# Patient Record
Sex: Male | Born: 1950 | Race: White | Hispanic: No | Marital: Married | Smoking: Current every day smoker
Health system: Southern US, Community
[De-identification: ages and names within clinical notes are randomized; demographics above are authoritative.]

## PROBLEM LIST (undated history)

## (undated) DIAGNOSIS — E785 Hyperlipidemia, unspecified: Secondary | ICD-10-CM

## (undated) DIAGNOSIS — I10 Essential (primary) hypertension: Secondary | ICD-10-CM

## (undated) DIAGNOSIS — I2510 Atherosclerotic heart disease of native coronary artery without angina pectoris: Secondary | ICD-10-CM

## (undated) DIAGNOSIS — I739 Peripheral vascular disease, unspecified: Secondary | ICD-10-CM

## (undated) DIAGNOSIS — J42 Unspecified chronic bronchitis: Secondary | ICD-10-CM

## (undated) HISTORY — DX: Essential (primary) hypertension: I10

## (undated) HISTORY — DX: Unspecified chronic bronchitis: J42

## (undated) HISTORY — DX: Hyperlipidemia, unspecified: E78.5

## (undated) HISTORY — PX: OTHER SURGICAL HISTORY: SHX169

## (undated) HISTORY — DX: Peripheral vascular disease, unspecified: I73.9

## (undated) HISTORY — DX: Atherosclerotic heart disease of native coronary artery without angina pectoris: I25.10

---

## 1995-09-16 HISTORY — PX: CORONARY ARTERY BYPASS GRAFT: SHX141

## 2004-03-31 ENCOUNTER — Ambulatory Visit: Payer: Self-pay | Admitting: Internal Medicine

## 2004-04-27 ENCOUNTER — Ambulatory Visit: Payer: Self-pay

## 2004-05-27 ENCOUNTER — Ambulatory Visit: Payer: Self-pay | Admitting: Cardiology

## 2004-06-22 ENCOUNTER — Ambulatory Visit: Payer: Self-pay | Admitting: Cardiology

## 2004-12-09 ENCOUNTER — Ambulatory Visit: Payer: Self-pay

## 2005-02-22 ENCOUNTER — Inpatient Hospital Stay (HOSPITAL_COMMUNITY): Admission: EM | Admit: 2005-02-22 | Discharge: 2005-02-25 | Payer: Self-pay | Admitting: Internal Medicine

## 2005-02-23 ENCOUNTER — Ambulatory Visit: Payer: Self-pay | Admitting: Cardiology

## 2005-02-23 HISTORY — PX: CARDIAC CATHETERIZATION: SHX172

## 2005-03-13 ENCOUNTER — Ambulatory Visit: Payer: Self-pay | Admitting: Cardiology

## 2005-04-26 ENCOUNTER — Ambulatory Visit: Payer: Self-pay | Admitting: Cardiology

## 2005-09-07 ENCOUNTER — Ambulatory Visit: Payer: Self-pay | Admitting: Cardiology

## 2006-05-17 ENCOUNTER — Ambulatory Visit: Payer: Self-pay | Admitting: Cardiology

## 2006-05-17 LAB — CONVERTED CEMR LAB
ALT: 18 units/L (ref 0–40)
AST: 24 units/L (ref 0–37)
Albumin: 4.1 g/dL (ref 3.5–5.2)
Alkaline Phosphatase: 53 units/L (ref 39–117)
Bilirubin, Direct: 0.1 mg/dL (ref 0.0–0.3)
Cholesterol: 140 mg/dL (ref 0–200)
Direct LDL: 77 mg/dL
HDL: 30.6 mg/dL — ABNORMAL LOW (ref >39.0)
LDL Cholesterol: 57 mg/dL (ref 0–99)
Total Bilirubin: 0.5 mg/dL (ref 0.3–1.2)
Total CHOL/HDL Ratio: 4.6
Total Protein: 7.4 g/dL (ref 6.0–8.3)
Triglycerides: 261 mg/dL (ref 0–149)
VLDL: 52 mg/dL — ABNORMAL HIGH (ref 0–40)

## 2006-11-02 ENCOUNTER — Ambulatory Visit: Payer: Self-pay | Admitting: Internal Medicine

## 2006-11-02 LAB — CONVERTED CEMR LAB
ALT: 16 units/L (ref 0–53)
AST: 20 units/L (ref 0–37)
Albumin: 3.7 g/dL (ref 3.5–5.2)
Alkaline Phosphatase: 47 units/L (ref 39–117)
BUN: 10 mg/dL (ref 6–23)
Basophils Absolute: 0.1 10*3/uL (ref 0.0–0.1)
Basophils Relative: 1 % (ref 0.0–1.0)
Bilirubin Urine: NEGATIVE
Bilirubin, Direct: 0.1 mg/dL (ref 0.0–0.3)
CO2: 32 meq/L (ref 19–32)
Calcium: 9.6 mg/dL (ref 8.4–10.5)
Chloride: 106 meq/L (ref 96–112)
Cholesterol: 169 mg/dL (ref 0–200)
Creatinine, Ser: 1 mg/dL (ref 0.4–1.5)
Eosinophils Absolute: 0.2 10*3/uL (ref 0.0–0.6)
Eosinophils Relative: 2.6 % (ref 0.0–5.0)
GFR calc Af Amer: 99 mL/min
GFR calc non Af Amer: 82 mL/min
Glucose, Bld: 98 mg/dL (ref 70–99)
HCT: 47.9 % (ref 39.0–52.0)
HDL: 28.1 mg/dL — ABNORMAL LOW (ref >39.0)
Hemoglobin, Urine: NEGATIVE
Hemoglobin: 16.4 g/dL (ref 13.0–17.0)
Ketones, ur: NEGATIVE mg/dL
LDL Cholesterol: 106 mg/dL — ABNORMAL HIGH (ref 0–99)
Leukocytes, UA: NEGATIVE
Lymphocytes Relative: 24.8 % (ref 12.0–46.0)
MCHC: 34.3 g/dL (ref 30.0–36.0)
MCV: 94.6 fL (ref 78.0–100.0)
Monocytes Absolute: 0.6 10*3/uL (ref 0.2–0.7)
Monocytes Relative: 9.3 % (ref 3.0–11.0)
Neutro Abs: 3.8 10*3/uL (ref 1.4–7.7)
Neutrophils Relative %: 62.3 % (ref 43.0–77.0)
Nitrite: NEGATIVE
Platelets: 248 10*3/uL (ref 150–400)
Potassium: 4.7 meq/L (ref 3.5–5.1)
RBC: 5.06 M/uL (ref 4.22–5.81)
RDW: 12.3 % (ref 11.5–14.6)
Sodium: 143 meq/L (ref 135–145)
Specific Gravity, Urine: 1.02 (ref 1.000–1.03)
TSH: 1.55 microintl units/mL (ref 0.35–5.50)
Total Bilirubin: 0.9 mg/dL (ref 0.3–1.2)
Total CHOL/HDL Ratio: 6
Total Protein, Urine: NEGATIVE mg/dL
Total Protein: 6.9 g/dL (ref 6.0–8.3)
Triglycerides: 174 mg/dL — ABNORMAL HIGH (ref 0–149)
Urine Glucose: NEGATIVE mg/dL
Urobilinogen, UA: 0.2 (ref 0.0–1.0)
VLDL: 35 mg/dL (ref 0–40)
WBC: 6.2 10*3/uL (ref 4.5–10.5)
pH: 6 (ref 5.0–8.0)

## 2007-02-27 ENCOUNTER — Ambulatory Visit: Payer: Self-pay | Admitting: Internal Medicine

## 2007-03-07 ENCOUNTER — Encounter: Payer: Self-pay | Admitting: Internal Medicine

## 2007-03-07 DIAGNOSIS — J209 Acute bronchitis, unspecified: Secondary | ICD-10-CM

## 2007-03-07 DIAGNOSIS — I2510 Atherosclerotic heart disease of native coronary artery without angina pectoris: Secondary | ICD-10-CM | POA: Insufficient documentation

## 2007-03-07 DIAGNOSIS — I259 Chronic ischemic heart disease, unspecified: Secondary | ICD-10-CM | POA: Insufficient documentation

## 2007-03-07 DIAGNOSIS — E785 Hyperlipidemia, unspecified: Secondary | ICD-10-CM | POA: Insufficient documentation

## 2007-03-07 DIAGNOSIS — I739 Peripheral vascular disease, unspecified: Secondary | ICD-10-CM

## 2007-03-07 DIAGNOSIS — J42 Unspecified chronic bronchitis: Secondary | ICD-10-CM

## 2008-06-16 ENCOUNTER — Telehealth (INDEPENDENT_AMBULATORY_CARE_PROVIDER_SITE_OTHER): Payer: Self-pay | Admitting: *Deleted

## 2008-07-23 ENCOUNTER — Telehealth: Payer: Self-pay | Admitting: Internal Medicine

## 2008-07-24 ENCOUNTER — Ambulatory Visit: Payer: Self-pay | Admitting: Internal Medicine

## 2008-07-24 DIAGNOSIS — I10 Essential (primary) hypertension: Secondary | ICD-10-CM

## 2008-07-24 DIAGNOSIS — F528 Other sexual dysfunction not due to a substance or known physiological condition: Secondary | ICD-10-CM

## 2008-07-24 LAB — CONVERTED CEMR LAB
ALT: 14 units/L (ref 0–53)
AST: 19 units/L (ref 0–37)
Albumin: 3.9 g/dL (ref 3.5–5.2)
Alkaline Phosphatase: 51 units/L (ref 39–117)
BUN: 13 mg/dL (ref 6–23)
Basophils Absolute: 0 10*3/uL (ref 0.0–0.1)
Basophils Relative: 0.2 % (ref 0.0–3.0)
Bilirubin, Direct: 0.2 mg/dL (ref 0.0–0.3)
CO2: 31 meq/L (ref 19–32)
CRP, High Sensitivity: 6 — ABNORMAL HIGH (ref 0.00–5.00)
Calcium: 9.6 mg/dL (ref 8.4–10.5)
Chloride: 106 meq/L (ref 96–112)
Cholesterol: 172 mg/dL (ref 0–200)
Creatinine, Ser: 1.1 mg/dL (ref 0.4–1.5)
Eosinophils Absolute: 0.1 10*3/uL (ref 0.0–0.7)
Eosinophils Relative: 2 % (ref 0.0–5.0)
GFR calc non Af Amer: 72.82 mL/min (ref >60)
Glucose, Bld: 99 mg/dL (ref 70–99)
HCT: 46.2 % (ref 39.0–52.0)
HDL: 32 mg/dL — ABNORMAL LOW (ref >39.00)
Hemoglobin: 16.4 g/dL (ref 13.0–17.0)
LDL Cholesterol: 103 mg/dL — ABNORMAL HIGH (ref 0–99)
Lymphocytes Relative: 25.3 % (ref 12.0–46.0)
Lymphs Abs: 1.6 10*3/uL (ref 0.7–4.0)
MCHC: 35.4 g/dL (ref 30.0–36.0)
MCV: 94.9 fL (ref 78.0–100.0)
Monocytes Absolute: 0.5 10*3/uL (ref 0.1–1.0)
Monocytes Relative: 7.4 % (ref 3.0–12.0)
Neutro Abs: 4 10*3/uL (ref 1.4–7.7)
Neutrophils Relative %: 65.1 % (ref 43.0–77.0)
Platelets: 222 10*3/uL (ref 150.0–400.0)
Potassium: 4.9 meq/L (ref 3.5–5.1)
RBC: 4.88 M/uL (ref 4.22–5.81)
RDW: 12.7 % (ref 11.5–14.6)
Sodium: 142 meq/L (ref 135–145)
TSH: 2.2 microintl units/mL (ref 0.35–5.50)
Total Bilirubin: 1 mg/dL (ref 0.3–1.2)
Total CHOL/HDL Ratio: 5
Total Protein: 7.2 g/dL (ref 6.0–8.3)
Triglycerides: 184 mg/dL — ABNORMAL HIGH (ref 0.0–149.0)
VLDL: 36.8 mg/dL (ref 0.0–40.0)
WBC: 6.2 10*3/uL (ref 4.5–10.5)

## 2009-05-25 ENCOUNTER — Telehealth (INDEPENDENT_AMBULATORY_CARE_PROVIDER_SITE_OTHER): Payer: Self-pay | Admitting: *Deleted

## 2009-05-27 ENCOUNTER — Telehealth (INDEPENDENT_AMBULATORY_CARE_PROVIDER_SITE_OTHER): Payer: Self-pay | Admitting: *Deleted

## 2009-06-23 ENCOUNTER — Ambulatory Visit: Payer: Self-pay | Admitting: Internal Medicine

## 2009-06-23 LAB — CONVERTED CEMR LAB
ALT: 20 units/L (ref 0–53)
AST: 21 units/L (ref 0–37)
Albumin: 3.9 g/dL (ref 3.5–5.2)
Alkaline Phosphatase: 57 units/L (ref 39–117)
BUN: 12 mg/dL (ref 6–23)
Basophils Absolute: 0.1 10*3/uL (ref 0.0–0.1)
Basophils Relative: 0.8 % (ref 0.0–3.0)
Bilirubin, Direct: 0 mg/dL (ref 0.0–0.3)
CO2: 31 meq/L (ref 19–32)
CRP, High Sensitivity: 7.1 — ABNORMAL HIGH (ref 0.00–5.00)
Calcium: 9.8 mg/dL (ref 8.4–10.5)
Chloride: 108 meq/L (ref 96–112)
Cholesterol: 177 mg/dL (ref 0–200)
Creatinine, Ser: 1.1 mg/dL (ref 0.4–1.5)
Direct LDL: 99.2 mg/dL
Eosinophils Absolute: 0.2 10*3/uL (ref 0.0–0.7)
Eosinophils Relative: 2.5 % (ref 0.0–5.0)
GFR calc non Af Amer: 72.59 mL/min (ref >60)
Glucose, Bld: 98 mg/dL (ref 70–99)
HCT: 49.3 % (ref 39.0–52.0)
HDL: 39.6 mg/dL (ref >39.00)
Hemoglobin: 16.5 g/dL (ref 13.0–17.0)
LDH: 137 units/L (ref 94–250)
Lymphocytes Relative: 21.7 % (ref 12.0–46.0)
Lymphs Abs: 1.5 10*3/uL (ref 0.7–4.0)
MCHC: 33.4 g/dL (ref 30.0–36.0)
MCV: 98.2 fL (ref 78.0–100.0)
Monocytes Absolute: 0.6 10*3/uL (ref 0.1–1.0)
Monocytes Relative: 9 % (ref 3.0–12.0)
Neutro Abs: 4.3 10*3/uL (ref 1.4–7.7)
Neutrophils Relative %: 66 % (ref 43.0–77.0)
Platelets: 233 10*3/uL (ref 150.0–400.0)
Potassium: 4.4 meq/L (ref 3.5–5.1)
RBC: 5.01 M/uL (ref 4.22–5.81)
RDW: 11.9 % (ref 11.5–14.6)
Sodium: 143 meq/L (ref 135–145)
Total Bilirubin: 0.5 mg/dL (ref 0.3–1.2)
Total CHOL/HDL Ratio: 4
Total Protein: 7.3 g/dL (ref 6.0–8.3)
Triglycerides: 273 mg/dL — ABNORMAL HIGH (ref 0.0–149.0)
VLDL: 54.6 mg/dL — ABNORMAL HIGH (ref 0.0–40.0)
WBC: 6.7 10*3/uL (ref 4.5–10.5)

## 2009-06-25 ENCOUNTER — Ambulatory Visit: Payer: Self-pay | Admitting: Internal Medicine

## 2009-06-25 DIAGNOSIS — F17210 Nicotine dependence, cigarettes, uncomplicated: Secondary | ICD-10-CM

## 2009-07-19 ENCOUNTER — Telehealth: Payer: Self-pay | Admitting: Internal Medicine

## 2009-08-20 ENCOUNTER — Ambulatory Visit: Payer: Self-pay | Admitting: Internal Medicine

## 2010-05-19 NOTE — Assessment & Plan Note (Signed)
Summary: Primary svc/ f/u hbp, try back on ACE   Primary Provider/Referring Provider:  Sherene Sires  CC:  6 wk followup off ACE.  Pt states that his cough has improved since last seen.  He states that he has flet flushed since started on diovan and that he feels like BP has been increased.  He also has noticed blurry vision and muscle pains.  .  History of Present Illness: 5 yowm smoker with IHD/PVD no longer followed in Cardiology since Dr Samule Ohm left but still on plavix and asa  s/p CABG '97 and stent in 206  July 24, 2008 ov new co = ED.  no increase tia or claudication symptoms. Pt denies any significant sore throat, nasal congestion or excess secretions, fever, chills, sweats, unintended wt loss, pleuritic or exertional cp, orthopnea pnd or leg swelling.  rec viagra, responded.  June 25, 2009 cpx cc cough on and off  since before xmas , sometimes with green mucus, esp in am, esp for last month.  no sob. stop lisinopril and replace it with diovan 160 mg one daily use mucindex dm as needed for cough committ to quit smoking Please schedule a follow-up appointment in 6 weeks, sooner if needed  DISCUSS COLON SCREENING ON RETURN   Aug 20, 2009 6 wk followup off ACE.  Pt states that his cough has improved since last seen.  He states that he has flelt flushed since started on diovan and that he feels like BP has been increased.  He also has noticed blurry vision and muscle pains.  Pt denies any significant sore throat, dysphagia, itching, sneezing,  nasal congestion or excess secretions,  fever, chills, sweats, unintended wt loss, pleuritic or exertional cp, hempoptysis, change in activity tolerance  orthopnea pnd or leg swelling.   Current Medications (verified): 1)  Plavix 75 Mg  Tabs (Clopidogrel Bisulfate) .... Once Daily 2)  Adult Aspirin Low Strength 81 Mg  Tbdp (Aspirin) .... Once Daily 3)  Multi Vitamins .... Once Daily 4)  Simcor 500-20 Mg  Tb24 (Niacin-Simvastatin) .... Once Daily 5)   Nitro 0.4mg  .... As Needed 6)  Fish Oil 1200 Mg Caps (Omega-3 Fatty Acids) .Marland Kitchen.. 1 Once Daily 7)  Viagra 50 Mg Tabs (Sildenafil Citrate) .... One Tablet One Hour Before Activity 8)  Diovan 160 Mg  Tabs (Valsartan) .... One By Mouth Daily  Allergies (verified): No Known Drug Allergies  Past History:  Past Medical History: HYPERLIPIDEMIA (ICD-272.4) CAD (ICD-414.00)     - s/p CABG 1997     - LHC  02/23/2005 > stent of graft to cx, ef 55% PERIPHERAL VASCULAR DISEASE (ICD-443.9)     - 12/09/04 carotid dopplers  < 80% Right,  < 60 Left BRONCHITIS, CHRONIC (ICD-491.9) HYPERTENSION     - ACE d/c June 25, 2009 (chronic cough) >  resolved HYPERLIPIDEMIA (ICD-272.4)     - Target LDL < 70 due to ASCVCD HEALTH MAINTENANCE..............................................................Marland KitchenWert     -DT  July 24, 2008      -Pneumovax (second shot) July 24, 2008      -CPX June 25, 2009      -Colon screening rec 04/07/2004 > declined > rediscussed Aug 20, 2009 >> declined again  Family History: MI younger brother (x2 y younger)dsf MI Father @ 73 No cancer  Vital Signs:  Patient profile:   60 year old male Weight:      178 pounds O2 Sat:      100 % on Room  air Temp:     97.2 degrees F oral Pulse rate:   77 / minute BP sitting:   120 / 80  (left arm)  Vitals Entered By: Vernie Murders (Aug 20, 2009 9:10 AM)  O2 Flow:  Room air  Physical Exam  Additional Exam:  wt  181 July 24, 2008 > 178 June 25, 2009 > 178 Aug 20, 2009  HEENT: upper dentures, nl  turbinates, and orophanx. Nl external ear canals without cough reflex NECK :  without JVD/Nodes/TM/ nl carotid upstrokes bilaterally LUNGS: no acc muscle use, clear to A and P bilaterally without cough on insp or exp maneuvers CV:  RRR  no s3 or murmur or increase in P2, no edema   ABD:  soft and nontender with nl excursion in the supine position. No bruits or organomegaly, bowel sounds nl MS:  warm without deformities, calf tenderness,  cyanosis or clubbing SKIN: warm and dry without lesions      Impression & Recommendations:  Problem # 1:  HYPERTENSION, BENIGN (ICD-401.1)  The following medications were removed from the medication list:    Diovan 160 Mg Tabs (Valsartan) ..... One by mouth daily His updated medication list for this problem includes:    Lisinopril 10 Mg Tabs (Lisinopril) ..... One daily  Cough resolved.  Pt not convinced it was the ace and felt better overall on lisnopril and ok with me to rechallenge. See instructions for specific recommendations   Discussed the relative indication of ace here but emphasized he should be the same benefit from ARB and need to switch back to arb if cough recurs for any reason  Orders: Est. Patient Level III ([address])  Problem # 2:  SMOKER (ICD-305.1)  Discussed but not ready to committ to quit at this point - emphasized risks involved in continuing smoking and that patient should consider these in the context of the cost of smoking relative to the benefit obtained.   Also decline colon screening.  Orders: Est. Patient Level III ([address])  Medications Added to Medication List This Visit: 1)  Lisinopril 10 Mg Tabs (Lisinopril) .... One daily  Patient Instructions: 1)  ok to restart lisinopril but if chronic cough comes back you'll need to stop it until next visit 2)  GERD (REFLUX)  is a common cause of respiratory symptoms. It commonly presents without heartburn and can be treated with medication, but also with lifestyle changes including avoidance of late meals, excessive alcohol, smoking cessation, and avoid fatty foods, chocolate, peppermint, colas, red wine, and acidic juices such as orange juice. NO MINT OR MENTHOL PRODUCTS SO NO COUGH DROPS  3)  USE SUGARLESS CANDY INSTEAD (jolley ranchers)  4)  NO OIL BASED VITAMINS (Fish oil should be stopped if the cough comes back) 5)  Return to office in 3 months, sooner if needed

## 2010-05-19 NOTE — Progress Notes (Signed)
Summary: rx  Phone Note Call from Patient Call back at [phone]   Caller: Spouse-Eva Call For: Teka Chanda Reason for Call: Refill Medication, Talk to Nurse Summary of Call: Want an RX for Diovan called in to see if pt's insurance will pay. [facility]  Initial call taken by: Eugene Gavia,  July 19, 2009 11:45 AM  Follow-up for Phone Call        per last ov note pt was to start diovan and was given 8 weeks of  samples. Spoke with pt wife and she states they were only given 4 weeks of samples and f/u in 6 weeks. Pt is almost out of samples. She states stopping ace seems to have helped cough. Checked and we have no samples of diovan so I will send in rx to last until f/u appt. Wife aware.   Carron Curie CMA  July 19, 2009 12:03 PM     Prescriptions: DIOVAN 160 MG  TABS (VALSARTAN) One by mouth daily  #30 x 0   Entered by:   Carron Curie CMA   Authorized by:   Nyoka Cowden MD   Signed by:   Carron Curie CMA on 07/19/2009   Method used:   Electronically to        [facility]* (retail)       [address] [place], [place]  [address]       Ph: [phone] or [phone]       Fax: [phone]   RxID:   [id number]

## 2010-05-19 NOTE — Assessment & Plan Note (Signed)
Summary: Primary svc/ cpx  new cough, try off ace   Primary Provider/Referring Provider:  Sherene Sires   History of Present Illness: 89 yowm smoker with IHD/PVD no longer followed in Cardiology since Dr Samule Ohm left but still on plavix and asa  s/p CABG '97 and stent in 206  July 24, 2008 ov new co = ED.  no increase tia or claudication symptoms. Pt denies any significant sore throat, nasal congestion or excess secretions, fever, chills, sweats, unintended wt loss, pleuritic or exertional cp, orthopnea pnd or leg swelling.  rec viagra, responded.  June 25, 2009 cpx cc cough on and off  since before xmas , sometimes with green mucus, esp in am, esp for last month.  no sob.  Pt denies any significant sore throat, dysphagia, itching, sneezing,  nasal congestion or excess nasal secretions,  fever, chills, sweats, unintended wt loss, pleuritic or exertional cp, hempoptysis, change in activity tolerance  orthopnea pnd or leg swelling Pt also denies any obvious fluctuation in symptoms with weather or environmental change or other alleviating or aggravating factors.       Current Medications (verified): 1)  Plavix 75 Mg  Tabs (Clopidogrel Bisulfate) .... Once Daily 2)  Adult Aspirin Low Strength 81 Mg  Tbdp (Aspirin) .... Once Daily 3)  Lisinopril 10 Mg  Tabs (Lisinopril) .... Once Daily 4)  Multi Vitamins .... Once Daily 5)  Simcor 500-20 Mg  Tb24 (Niacin-Simvastatin) .... Once Daily 6)  Nitro 0.4mg  .... As Needed 7)  Fish Oil 1200 Mg Caps (Omega-3 Fatty Acids) .Marland Kitchen.. 1 Once Daily 8)  Viagra 50 Mg Tabs (Sildenafil Citrate) .... One Tablet One Hour Before Activity  Allergies (verified): No Known Drug Allergies  Past History:  Past Medical History: HYPERLIPIDEMIA (ICD-272.4) CAD (ICD-414.00)     - s/p CABG 1997     - LHC  02/23/2005 > stent of graft to cx, ef 55% PERIPHERAL VASCULAR DISEASE (ICD-443.9)     - 12/09/04 carotid dopplers  < 80% Right,  < 60 Left BRONCHITIS, CHRONIC  (ICD-491.9) HYPERTENSION     - ACE d/c June 25, 2009 (chronic cough) HYPERLIPIDEMIA (ICD-272.4)     - Target LDL < 70 due to ASCVCD HEALTH MAINTENANCE..............................................................Marland KitchenWert     -DT  July 24, 2008      -Pneumovax (second shot) July 24, 2008      -CPX June 25, 2009      -Colon screening rec 04/07/2004 > declined  Family History: MI younger brother (x2 y younger)dsf MI Father @ 63  Social History: Current smoker.  Smokes average of 1/4 ppd. works sharpening blades  Vital Signs:  Patient profile:   60 year old male Weight:      178 pounds BMI:     24.92 O2 Sat:      97 % on Room air Temp:     97.2 degrees F oral Pulse rate:   60 / minute BP sitting:   118 / 70  (left arm)  Vitals Entered By: Vernie Murders (June 25, 2009 8:54 AM)  O2 Flow:  Room air  Physical Exam  Additional Exam:  wt 186 > 181 July 24, 2008 > 178 June 25, 2009  HEENT: upper dentures, nl  turbinates, and orophanx. Nl external ear canals without cough reflex NECK :  without JVD/Nodes/TM/ nl carotid upstrokes bilaterally LUNGS: no acc muscle use, clear to A and P bilaterally without cough on insp or exp maneuvers CV:  RRR  no s3 or  murmur or increase in P2, no edema   ABD:  soft and nontender with nl excursion in the supine position. No bruits or organomegaly, bowel sounds nl MS:  warm without deformities, calf tenderness, cyanosis or clubbing SKIN: warm and dry without lesions   NEURO:  alert, approp, no deficits Testes atophy on right, up on L with no ih Rectal mild bph, smooth, stool g neg   Sodium                    143 mEq/L                   135-145   Potassium                 4.4 mEq/L                   3.5-5.1   Chloride                  108 mEq/L                   96-112   Carbon Dioxide            31 mEq/L                    19-32   Glucose                   98 mg/dL                    78-29   BUN                       12 mg/dL                     5-62   Creatinine                1.1 mg/dL                   1.3-0.8   Calcium                   9.8 mg/dL                   6.5-78.4   GFR                       72.59 mL/min                >60  Tests: (2) Lipid Panel (LIPID)   Cholesterol               177 mg/dL                   6-962     ATP III Classification            Desirable:  < 200 mg/dL                    Borderline High:  200 - 239 mg/dL               High:  > = 240 mg/dL   Triglycerides        [H]  273.0 mg/dL                 9.5-284.1     Normal:  <150 mg/dL  Borderline High:  150 - 199 mg/dL   HDL                       56.21 mg/dL                 >30.86   VLDL Cholesterol     [H]  54.6 mg/dL                  5.7-84.6  CHO/HDL Ratio:  CHD Risk                             4                    Men          Women     1/2 Average Risk     3.4          3.3     Average Risk          5.0          4.4     2X Average Risk          9.6          7.1     3X Average Risk          15.0          11.0                           Tests: (3) CBC Platelet w/Diff (CBCD)   White Cell Count          6.7 K/uL                    4.5-10.5   Red Cell Count            5.01 Mil/uL                 4.22-5.81   Hemoglobin                16.5 g/dL                   96.2-95.2   Hematocrit                49.3 %                      39.0-52.0   MCV                       98.2 fl                     78.0-100.0   MCHC                      33.4 g/dL                   84.1-32.4   RDW                       11.9 %                      11.5-14.6   Platelet Count            233.0 K/uL  150.0-400.0   Neutrophil %              66.0 %                      43.0-77.0   Lymphocyte %              21.7 %                      12.0-46.0   Monocyte %                9.0 %                       3.0-12.0   Eosinophils%              2.5 %                       0.0-5.0   Basophils %               0.8 %                       0.0-3.0   Neutrophill  Absolute      4.3 K/uL                    1.4-7.7   Lymphocyte Absolute       1.5 K/uL                    0.7-4.0   Monocyte Absolute         0.6 K/uL                    0.1-1.0  Eosinophils, Absolute                             0.2 K/uL                    0.0-0.7   Basophils Absolute        0.1 K/uL                    0.0-0.1  Tests: (4) Hepatic/Liver Function Panel (HEPATIC)   Total Bilirubin           0.5 mg/dL                   1.6-1.0   Direct Bilirubin          0.0 mg/dL                   9.6-0.4   Alkaline Phosphatase      57 U/L                      39-117   AST                       21 U/L                      0-37   ALT                       20 U/L  0-53   Total Protein             7.3 g/dL                    6.5-7.8   Albumin                   3.9 g/dL                    4.6-9.6  Tests: (5) Full Range CRP (FCRP)   Full Range CRP       [H]  7.10 mg/L                   0.00-5.00     Note:  An elevated hs-CRP (>5 mg/L) should be repeated after 2 weeks to rule out recent infection or trauma.  Tests: (6) Cholesterol LDL - Direct (DIRLDL)  Cholesterol LDL - Direct                             99.2 mg/dL  CXR  Procedure date:  06/25/2009  Findings:      Comparison: 11/02/2006   Findings: Trachea is midline.  Heart size stable.  Lungs are somewhat low in volume with mild coarsening of the pulmonary markings at the lung bases.  Right apical scarring.  Lungs are otherwise clear.  No pleural fluid.   IMPRESSION: Mild coarsening of the basilar pulmonary markings.  Chest CT may be helpful in further evaluation, as clinically indicated.  EKG  Procedure date:  06/25/2009  Findings:      nsr, wnl  Impression & Recommendations:  Problem # 1:  HYPERTENSION, BENIGN (ICD-401.1)  The following medications were removed from the medication list:    Lisinopril 10 Mg Tabs (Lisinopril) ..... Once daily His updated medication list for this problem includes:     Diovan 160 Mg Tabs (Valsartan) ..... One by mouth daily   ACE inhibitors are problematic in  pts with airway complaints because  even experienced pulmononlogists can't always distinguish ace effects from copd/asthma.  By themselves they don't actually cause a problem, much like oxygen can't by itself start a fire, but they certainly serve as a powerful catalyst or enhancer for any "fire"  or inflammatory process in the upper airway, be it caused by an ET  tube or more commonly reflux (especially in the obese or pts with known GERD or who are on biphoshonates).  In the era of ARB near equivalency until we have a better handle on the reversibility of the airway problem, it just makes sense to avoid ace entirely in the short run and then decide later, having established a level of airway control using a reasonable limited regimen, whether to add back ace but even then being very careful to observe the pt for worsening airway control and number of meds used/ needed to control symptoms.    Try off ace x 6 weeks then ov  Problem # 2:  HYPERLIPIDEMIA (ICD-272.4)  His updated medication list for this problem includes:    Simcor 500-20 Mg Tb24 (Niacin-simvastatin) ..... Once daily  Labs Reviewed: SGOT: 21 (06/23/2009)   SGPT: 20 (06/23/2009)   HDL:39.60 (06/23/2009), 32.00 (07/24/2008)  LDL:103 (07/24/2008), 106 (11/02/2006)  > 99 June 25, 2009    Chol:177 (06/23/2009), 172 (07/24/2008)  Trig:273.0 (06/23/2009), 184.0 (07/24/2008)  Problem # 3:  ERECTILE DYSFUNCTION (ICD-302.72) viagra refilled  Problem # 4:  SMOKER (  ICD-305.1) Discussed but not ready to committ to quit at this point - emphasized risks involved in continuing smoking and that patient should consider these in the context of the cost of smoking relative to the benefit obtained.   Medications Added to Medication List This Visit: 1)  Viagra 50 Mg Tabs (Sildenafil citrate) .... One tablet one hour before activity 2)  Diovan 160 Mg Tabs  (Valsartan) .... One by mouth daily  Other Orders: EKG w/ Interpretation (93[address]) Est. Patient 40-64 years ([address]) T-2 View CXR (71020TC)  Patient Instructions: 1)  stop lisinopril and replace it with diovan 160 mg one daily 2)  use mucindex dm as needed for cough 3)  committ to quit smoking 4)  Please schedule a follow-up appointment in 6 weeks, sooner if needed  5)  DISCUSS COLON SCREENING ON RETURN Prescriptions: VIAGRA 50 MG TABS (SILDENAFIL CITRATE) one tablet one hour before activity  #10 x 11   Entered and Authorized by:   Nyoka Cowden MD   Signed by:   Nyoka Cowden MD on 06/25/2009   Method used:   Electronically to        [facility]* (retail)       [address] [place], [place]  [address]       Ph: [phone] or [phone]       Fax: [phone]   RxID:   [id number]    [facility]io[employer] ECG  ID: [id number] Patient: [name] [name], [name] [name] DOB: 1950-11-26 Age: 60 Years Old Sex: Male Race: White Height: 71 Weight: 178 Status: Unconfirmed Past Medical History:  HYPERLIPIDEMIA (ICD-272.4) CAD (ICD-414.00)     - s/p CABG 1997     - LHC  02/23/2005 > stent of graft to cx PERIPHERAL VASCULAR DISEASE (ICD-443.9)     - 12/09/04 carotid dopplers  < 80% Right,  < 60 Left BRONCHITIS, CHRONIC (ICD-491.9) HYPERLIPIDEMIA (ICD-272.4)     - Target LDL < 70 due to ASCVCD HEALTH MAINTENANCE..............................................................Marland KitchenWert     -DT  July 24, 2008      -Pneumovax (second shot) July 24, 2008    Recorded: 06/25/2009 09:02 AM P/PR: 108 ms / 173 ms - Heart rate (maximum exercise) QRS: 85 QT/QTc/QTd: 387 ms / 398 ms / 100 ms - Heart rate (maximum exercise)  P/QRS/T axis: 37 deg / 30 deg / 73 deg - Heart rate (maximum exercise)  Heartrate: 66 bpm  Interpretation:  nsr, wnl

## 2010-05-19 NOTE — Progress Notes (Signed)
Summary: f/u with dr wert  Phone Note Call from Patient Call back at Home Phone [phone]   Caller: Patient Call For: wert Summary of Call: pt called back. i advised pt that rx for simcor has been sent to pharmacy but pt needed to schedule a f/u for any refills. pt made a f/u for 06/25/09. however, pt wants to make sure nurse sets up labs as pt says that mw will want to do blood work when pt returns and pt will come in before that date. also will he need to fast? call pt at work [phone] or at home # Initial call taken by: Tivis Ringer, CNA,  May 27, 2009 2:03 PM  Follow-up for Phone Call        Pt was sched for cpx 3/11.  Wants to come in prior to appt and have fasting labs.  Please advise what you want ordered thanks! Vernie Murders  May 27, 2009 2:19 PM Fasting lipids, bmet, cbc, c reactive protein, LFT's and LDH Follow-up by: Nyoka Cowden MD,  May 27, 2009 2:56 PM  Additional Follow-up for Phone Call Additional follow up Details #1::        Labs ordered and pt aware to come fasting when has blood drawn. Additional Follow-up by: Vernie Murders,  May 27, 2009 3:24 PM

## 2010-05-19 NOTE — Progress Notes (Signed)
Summary: refill   LMTCB x 1  Phone Note Call from Patient   Caller: Patient Call For: wert Summary of Call: need refill for [facility]ort [facility] Initial call taken by: Rickard Patience,  May 25, 2009 4:15 PM  Follow-up for Phone Call        [facility].  Symicort not on med list.  Does he mean Simcor?  Also, pt needs ov with MW.  Last seen April 2010 and was told to f/u at the end of the year 2010 for CPX.  Aundra Millet Reynolds LPN  May 25, 2009 4:39 PM   ATC pt's home number-unable to leave message.  Will try back later.  Gweneth Dimitri RN  May 26, 2009 12:08 PM   Additional Follow-up for Phone Call Additional follow up Details #1::        ATC pt's home number-unable to leave message. ATC pt's work number-that number is no longer in service.  Gweneth Dimitri RN  May 26, 2009 2:23 PM     Additional Follow-up for Phone Call Additional follow up Details #2::    LMOMTCB Vernie Murders  May 27, 2009 11:12 AM  We recived rx refill request for simcor.  I will refill x 1 only and advised pharmacist that pt must sched appt for refills. Follow-up by: Vernie Murders,  May 27, 2009 11:26 AM  Prescriptions: [facility] 500-20 MG  TB24 (NIACIN-SIMVASTATIN) once daily  #30 x 0   Entered by:   Vernie Murders   Authorized by:   Nyoka Cowden MD   Signed by:   Vernie Murders on 05/27/2009   Method used:   Electronically to        [facility]* (retail)       [address] [place], [place]  [address]       Ph: [phone] or [phone]       Fax: [phone]   RxID:   [id number]

## 2010-08-03 ENCOUNTER — Other Ambulatory Visit: Payer: Self-pay | Admitting: Internal Medicine

## 2010-08-03 MED ORDER — NIACIN-SIMVASTATIN ER 500-20 MG PO TB24: 1.0000 | ORAL_TABLET | Freq: Every day | ORAL | Status: DC

## 2010-08-03 NOTE — Telephone Encounter (Signed)
Error in creating this encounter.

## 2010-08-03 NOTE — Telephone Encounter (Signed)
Refill request faxed back to [facility] with denial by Verlon Au.

## 2010-08-23 ENCOUNTER — Telehealth: Payer: Self-pay | Admitting: Internal Medicine

## 2010-08-23 DIAGNOSIS — Z0000 Encounter for general adult medical examination without abnormal findings: Secondary | ICD-10-CM

## 2010-08-23 DIAGNOSIS — I10 Essential (primary) hypertension: Secondary | ICD-10-CM

## 2010-08-23 DIAGNOSIS — E785 Hyperlipidemia, unspecified: Secondary | ICD-10-CM

## 2010-08-23 MED ORDER — NIACIN-SIMVASTATIN ER 500-20 MG PO TB24: 1.0000 | ORAL_TABLET | Freq: Every day | ORAL | Status: DC

## 2010-08-23 NOTE — Telephone Encounter (Signed)
Pt is sch for 09/16/2010 @ 8:45 am for CPX. Will send refill for Simcor, enough until appt. Pt would like to come in early on 6/1 to have blood work. Pls advise on labs to be done.

## 2010-08-23 NOTE — Telephone Encounter (Signed)
PSA, fasting lipid profile, bmet, lft's TSH and u/a and crp

## 2010-08-23 NOTE — Telephone Encounter (Signed)
Pt is past due for CPX with MW. Spouse will check pt's schedule and callback to make the appt. We can send RX for enough medication until OV.

## 2010-08-23 NOTE — Telephone Encounter (Signed)
Pt spouse Carley Hammed called back stated that she was returning a call to [employer] she is at home now can be reached at [phone].Vedia Coffer

## 2010-08-24 NOTE — Telephone Encounter (Signed)
Labs ordered and pt's spouse aware pt needs to be fasting for lab work.

## 2010-08-30 NOTE — Assessment & Plan Note (Signed)
 HEALTHCARE                             PULMONARY OFFICE NOTE   NAME:Revoir, CELESTINE BOUGIE                         MRN:          [id number]  DATE:02/27/2007                            DOB:          1951-02-21    HISTORY:  A 60 year old white male, active smoker with a history of  ischemic heart disease in for followup evaluation of hyperlipidemia and  hypertension.  He is doing well with no significant exertional chest  pain.  There is no orthopnea, PND or leg swelling.   MEDICATIONS:  Reviewed in detail on the inventory and face sheet column  dated February 27, 2007.  Although he takes Vytorin 10/40 one daily he  is not at goal in either terms of LDL or HDL for a patient with ischemic  heart disease by previous records.   PHYSICAL EXAMINATION:  He is a pleasant, ambulatory, robust white male  in no acute distress.  He is afebrile, normal vital signs.  Blood  pressure 120/88.  HEENT:  Unremarkable, oropharynx clear.  LUNGS:  Fields clear bilaterally to auscultation and percussion.  Regular rhythm without murmur, gallop, rub.  ABDOMEN:  Soft, benign.  EXTREMITIES:  Warm without calf tenderness, cyanosis, clubbing, edema.   IMPRESSION:  1. Hypertension, well controlled on present regimen which I did not      change.  2. Hyperlipidemia, LDL not at target (less than 70 LDL, greater than      40 HDL) on Vytorin.  I am going to switch him over to Simcor 20/500      one q.p.m. along with shifting his aspirin to a p.m. dose.  If he      can tolerate this I would like him to return in 4 weeks for recheck      liver function test and lipid profile.  If not, I have asked him to      resume the Vytorin pending his next evaluation in 3 months.     Charlaine Dalton. Sherene Sires, MD, [facility]  Electronically Signed    MBW/MedQ  DD: 02/27/2007  DT: 02/28/2007  Job #: [id number]

## 2010-09-02 NOTE — Assessment & Plan Note (Signed)
[facility] HEALTHCARE                            CARDIOLOGY OFFICE NOTE   NAME:[name] [name], [name] [name]                         MRN:          [id number]  DATE:05/17/2006                            DOB:          [birth year]    PRIMARY CARE PHYSICIAN:  Charlaine Dalton. Sherene Sires, M.D.   HISTORY OF PRESENT ILLNESS:  [name] [name] is a 60 year old gentleman with  atherosclerotic coronary disease.  He underwent coronary artery bypass  grafting surgery in 1997.  In November 2006 he suffered a non-ST-  elevation myocardial infarction which was treated with placement of a  Taxus drug-eluting stent in the vein graft to the marginal.  Since then,  he has done well.  He has not had any recurrent chest discomfort or  exertional dyspnea.  He remains active.  He has not had any orthopnea,  PND, palpitations, syncope, presyncope, or claudication.   He has cut back on smoking from two packs per day to about a half pack  per day.  He says he is not too interested in quitting.   His current medications are:  1. Plavix 75 mg per day.  2. Aspirin 81 mg per day.  3. Lisinopril 10 mg per day.  4. Toprol-XL 25 mg per day.  5. Vytorin 10/40.  6. Vitamin C.  7. Multivitamin.   PHYSICAL EXAMINATION:  He is generally well-appearing in no distress  with heart rate of 68, blood pressure 122/74 and weight of 188 pounds.  He has no jugular venous distention, no thyromegaly.  Lungs are clear to  auscultation.  He has a nondisplaced point of maximal cardiac impulse.  There is a regular rate and rhythm without murmur, rub or gallop.  The  abdomen is soft, nontender, nondistended.  There is no  hepatosplenomegaly.  Bowel sounds are normal.  The extremities are warm  without clubbing, cyanosis, edema or ulceration.  Carotid pulses 2+  without bruit.  There is no midline pulsatile mass in the abdomen.   Electrocardiogram demonstrates normal sinus rhythm with borderline left  atrial enlargement and is otherwise  normal.   IMPRESSION/RECOMMENDATIONS:  1. Coronary disease:  Doing nicely after coronary artery bypass      grafting and subsequent stenting of a vein graft.  Due to his drug-      eluting stent in the vein graft, will continue aspirin and Plavix      indefinitely.  2. Ongoing tobacco abuse.  Again counseled him about critical      importance of complete cessation.  He seems fairly uninterested.      He said he would think about it.  3. Hypercholesterolemia:  Check lipids and LFTs today.  4. Lower extremity vascular disease:  Asymptomatic.   I will see him back in a year's time.     Salvadore Farber, MD  Electronically Signed    WED/MedQ  DD: 05/17/2006  DT: 05/17/2006  Job #: [id number]   cc:   Charlaine Dalton. Sherene Sires, MD, FCCP

## 2010-09-02 NOTE — Discharge Summary (Signed)
NAME:  [name] [name], [name] [name] NO.:  [id number]   MEDICAL RECORD NO.:  [id number]          PATIENT TYPE:  INP   LOCATION:  2001                         FACILITY:  [facility]   PHYSICIAN:  Willa Rough, M.D.     DATE OF BIRTH:  1950-11-28   DATE OF ADMISSION:  02/22/2005  DATE OF DISCHARGE:  02/25/2005                           DISCHARGE SUMMARY - REFERRING   DISCHARGE DIAGNOSES:  1.  Acute inferolateral non-Q-wave myocardial infarction status post      stenting of the saphenous vein graft to the obtuse marginal-2.  2.  Hyperlipidemia.  3.  Tobacco use.  4.  History of gastroesophageal reflux disease.  5.  Peripheral vascular disease.  6.  Cerebral vascular disease.   PROCEDURES PERFORMED:  Cardiac catheterization on February 23, 2005,  revealing native three vessel coronary artery disease, LIMA to the LAD was  patent.  Saphenous vein graft to the OM-2 had a 95% proximal stenosis with  thrombus.  Saphenous vein graft to the RCA was patent with a 30% proximal  stenosis.  The saphenous vein graft to the diagonal branch was totaled, he  had an EF of 55% with some slight anterior hypokinesis status post stenting  with TAXUS stent to the saphenous vein graft to the OM-1, reducing this from  95% to 0%.   SUMMARY OF HISTORY:  [name] [name] is a 60 year old white male who presented to  [facility] complaining of substernal chest squeezing and burning  sensation associated with shortness of breath, belching, bloating and  radiation into his neck and shoulders.  His symptoms actually began on  Monday, February 20, 2005, and have been intermittent since that time.  Due  to reoccurring symptoms, he presented to [facility] Emergency Room.  At the  time of presentation, EKG showed changes indicative of inferior myocardial  infarction.   His history is notable for:  1.  Myocardial infarction November, 1996, with PCI and stenting of the LAD      and PTCA of the circumflex.  2.  He  underwent bypass surgery in 1997, details unavailable.  3.  Peripheral vascular disease with ABIs in January, 2006, 0.69 and 0.7 on      the left and right respectively.  4.  Carotid ultrasound in January, 2006, showed a right ICA stenosis of 60-      79%.  5.  Hyperlipidemia.  6.  GERD.  7.  Ongoing tobacco use.  8.  Asthmatic bronchitis.   Admission chest x-ray showed no active disease status post bypass surgery.  Admission weight was 190, admission H&H 16.7 and 47.7, normal indices,  platelets 250, WBCs 9.3.  Subsequent hematologies were unremarkable.  Admission PTT was greater than 200, PT 15.1.  Admission sodium 139,  potassium 3.8, BUN 8, creatinine 1.1, glucose 106, normal LFTs.  Subsequent  chemistries are unremarkable.  Lipase was 30.  Initial CK MB was 183 and 9.2  with a relative index of 5.0 and troponin of 0.18.  CK MB rose to 1411 and  194.3 respectively at 8 hours with a relative index of  13.8 and a troponin  of 15.24.  Subsequent CK MBs and troponins were declining.  Fasting lipids  on the 9th showed a total cholesterol of 148, triglycerides 156, HDL 31, LDL  84.  TSH was 2.510.  EKG on admission showed normal sinus rhythm, normal  axis, approximately 1 mm ST segment depression in 1 and approximately 1 mm  elevation in lead 3.  Subsequent EKG showed increase change of the  respective leads.  Subsequent EKGs also showed post procedure normal sinus  rhythm, resolution of ST segment changes.   HOSPITAL COURSE:  [name] [name] was placed on IV heparin and admitted to [facility].  He underwent cardiac catheterization on February 23, 2005, by  Dr. Charlies Constable and received a TAXUS stent to the saphenous vein graft to  the OM.  Post sheath removal and bedrest he was ambulating without  difficulty, cardiac rehab assisted with ambulation and education.  Tobacco  cessation consult was also performed.  Dr. Samule Ohm noted that despite Lipitor  80 his LDL was still elevated; thus,  his Lipitor was changed to Vytorin.  By  February 25, 2005, Dr. Myrtis Ser assessed the patient and felt he could be  discharged home.   DISPOSITION:  The patient is discharged home.  He is asked to increase his  aspirin to 325 mg daily, continue Toprol XL 25 mg daily.  New medication  Plavix 75 mg daily, Vytorin 10/40 mg daily, and nitroglycerin 0.4 as needed.  He was asked not to take his Lipitor and he was asked to bring all  medications to all followup appointments.  He was advised low salt, low fat  diet.  He received supplemental instructions in regards to post  catheterization site wound care and activities per supplemental sheet.  He  was advised no lifting and driving for 1 week.  He was advised and counseled  in regards to smoking cessation.  He was asked to call our office to arrange  a followup appointment with Dr. Samule Ohm for a 2-3 week appointment.  He was  asked to obtain a primary care physician.   OTHER MEDICATION LISTED:  Protonix.      Joellyn Rued, P.A. [facility]    ______________________________  Willa Rough, M.D.    EW/MEDQ  D:  02/25/2005  T:  02/26/2005  Job:  [id number]   cc:   Salvadore Farber, M.D. [facility]  [address]  [address]  [place]  [place] [address]   Unknown primary care physician   Charlaine Dalton. Sherene Sires, M.D. [facility]  [address]  [place]  [place] [address]

## 2010-09-02 NOTE — H&P (Signed)
NAME:  [name] [name], [name] [name] NO.:  [id number]   MEDICAL RECORD NO.:  [id number]          PATIENT TYPE:  EMS   LOCATION:  MAJO                         FACILITY:  [facility]   PHYSICIAN:  Arvilla Meres, M.D. LHCDATE OF BIRTH:  1950-08-22   DATE OF ADMISSION:  02/22/2005  DATE OF DISCHARGE:                                HISTORY & PHYSICAL   PRIMARY CARDIOLOGIST:  Salvadore Farber, M.D.   PATIENT PROFILE:  The patient is a 60 year old married white male with prior  history of CAD who presents with non-ST-elevation MI.   PROBLEM:  1.  Coronary artery disease with non-ST-elevation myocardial infarction.      1.  Status post myocardial infarction in November 1996 with percutaneous          coronary intervention and stenting of the left anterior descending          and percutaneous transluminal cardiac angioplasty of the circumflex.      2.  Status post coronary artery bypass grafting in 1997.  2.  Peripheral vascular disease.      1.  Ankle brachial indices in January 2006 were 0.69 on the left and 0.7          on the right.      2.  Carotid ultrasound in January 2006 showed 60 to 79% right internal          carotid artery stenosis and insignificant plaquing on the left.  3.  Hyperlipidemia.  4.  Gastroesophageal reflux disease.  5.  Ongoing tobacco abuse, 30-pack-year history, currently smoking 1 pack a      day.  6.  Asthmatic bronchitis.   HISTORY OF PRESENT ILLNESS:  A 60 year old married white male with history  of CAD status post MI in 1996 with percutaneous coronary intervention of the  LAD and PTCA of the circumflex followed by CABG in 1997.  He was in his  usual state of health on Monday, February 20, 2005, when at work began to  experience intermittent, 7 to 8/10 substernal chest squeezing and burning  associated with mild shortness of breath, belching, and bloating with  radiation to his bilateral neck and shoulders lasting 1 to 2 minutes and  resolving  spontaneously only to recur again in 3 to 5 minutes.  His symptoms  were consistent throughout the day but resolved by the time he went to bed.  He had minimal symptoms yesterday until approximately 10 minutes after  eating his evening meal when symptoms recurred in a similar fashion as to  stated above.  He was restless all night with recurrent symptoms, and  secondary to persistence of symptoms, he presented to [facility] ED this  morning.   An EKG shows minimal 1 mm J point ST-segment depression in I and aVL, and  his first set of cardiac markers show an MB of 7.8, troponin I of 0.07 . We  are planning on admitting him for management of non-ST-elevation MI.  Currently he is complaining of 5 to 7/10 substernal squeezing.   ALLERGIES:  No known drug allergies.  HOME MEDICATIONS:  1.  Lipitor 80 mg nightly.  2.  Aspirin 325 mg daily.  3.  Toprol XL, unknown dose.   FAMILY HISTORY:  Mother is age 29, alive and well.  Father died at 59 of an  MI.  He has a brother who died of CVA.  He has another brother and sister  who are alive and well.   SOCIAL HISTORY:  He lives in [place] with his wife.  He has 2 grown  sons.  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  He continues to smoke a pack a day and has done so for the past  30 years.  Denies any alcohol or drug use.  He does not routinely exercise.   REVIEW OF SYSTEMS:  Positive for chest pain and mild shortness of breath as  well as abdominal bloating and frequent belching.  All other systems were  reviewed and negative.   PHYSICAL EXAMINATION:  VITAL SIGNS:  He is afebrile.  Heart rate 51,  respirations 16, blood pressure 105/73.  GENERAL: Pleasant white male in no acute distress.  Awake, alert, and  oriented x3.  NECK:  Normal carotid upstrokes with a soft bruit on the right.  No JVD.  LUNGS: Respirations regular and unlabored.  Clear to auscultation.  CARDIAC:  Regular S1 and S2.  No  S3, S4, or murmurs.  ABDOMEN:  Round, soft, nontender, nondistended.  Bowel sounds present x4.  EXTREMITIES: Warm, dry, and pink.  No clubbing, cyanosis, or edema.  Dorsalis pedis and posterior tibial are difficult to palpate.   EKG shows sinus rhythm with a rate of 77 and 1 mm J point depression in I  and aVL.   Labs show a hemoglobin of 16.7, hematocrit 47.4, WBC 9.3, platelets 250.  Sodium 139, potassium 3.8, chloride 103, CO2 27, BUN 8, creatinine 1.1,  glucose 106.  Total bilirubin 0.7, alkaline phosphatase 55, AST 27, ALT 18,  lipase 30, total protein 7, albumin 3.9.  CK-MB 7.8, troponin I 0.07.   ASSESSMENT AND PLAN:  1.  Coronary artery disease and non-ST-elevation myocardial infarction.  The      patient has had greater than 12 hours of intermittent symptoms with CK-      MB 7.8, troponin I 0.07.  Continue to cycle cardiac markers.  We will      add IV nitroglycerin, heparin, and Integrilin, and continue beta blocker      and statin therapy.  Will admit to CCU and plan for cardiac      catheterization in the morning with Dr. Juanda Chance. This is currently      scheduled for first case.  We will also add Plavix.  2.  Hyperlipidemia. Check lipids.  LFTs are okay.  Continue Lipitor 80.  3.  Tobacco abuse.  Smoking cessation strongly advised.  4.  Gastroesophageal reflux disease.  Will add PPI.      Ok Anis, NP      Arvilla Meres, M.D. [facility]  Electronically Signed    CRB/MEDQ  D:  02/22/2005  T:  02/22/2005  Job:  [id number]   cc:   Salvadore Farber, M.D. [facility]  [address]  [address]  [place]  [place] [address]

## 2010-09-02 NOTE — Cardiovascular Report (Signed)
NAME:  GABRIELA, GIANNELLI NO.:  [id number]   MEDICAL RECORD NO.:  [id number]          PATIENT TYPE:  INP   LOCATION:  2911                         FACILITY:  [facility]   PHYSICIAN:  Charlies Constable, M.D. [facility] DATE OF BIRTH:  12-24-1950   DATE OF PROCEDURE:  02/23/2005  DATE OF DISCHARGE:                              CARDIAC CATHETERIZATION   CLINICAL HISTORY:  Mr. Ishmael is 60 years old and had bypass surgery in 1997  after having previous percutaneous coronary interventions.  He did well  until recently and was admitted with chest pain and enzymes consistent with  a non ST elevation infarction.  He was scheduled for evaluation with  angiography today.   PROCEDURE:  The procedure was performed via the right femoral artery using  arterial sheath and 6-French preformed coronary catheters.  A front wall  arterial puncture was performed and Omnipaque contrast was used.  After  completion of the diagnostic study we made a decision to proceed with  intervention on the vein graft to the circumflex marginal vessel.   The patient was on an Integrilin drip and was given additional heparin to  prolong the ACT to greater than 200 seconds.  He was given 600 mg load of  Plavix.  We used an AL1 6-French guiding catheter with side holes.  We first  crossed the lesion in the proximal and ostial portion of the vein graft to  the marginal vessel with a Prowater wire.  Initially, there was TIMI 3 flow  in the vessel but after two injections through the guiding catheter before  passing the wire the flow degraded to TIMI 2 flow.  We then used a  Spiderwire for distal protection.  We chose the Spiderwire size 4.  We  advanced the Spiderwire and sheath over the Prowater wire.  We then removed  the Prowater wire and advanced the Spider to the distal end of the sheath in  the mid to distal portion of the vein graft.  We then removed the sheath  deploying the Spiderwire.  The next injection did show  TIMI 2 flow.  We then  deployed a 2.75 x 32 mm TAXUS stent extending this all the way to the  ostium.  We deployed this with one inflation up to 16 atmospheres for 30  seconds.  We then post dilated with a 3.25 x 20 mm Quantum Maverick  performing one inflation up to 14 atmospheres for 30 seconds.  We then  retrieved the Spider filter with the retrieval sheath.  Final diagnostic  studies were then performed through the guiding catheter.   The right femoral artery angiogram indicated the vessel was not suitable for  closure.  The patient tolerated the procedure well and left the laboratory  in satisfactory condition.   RESULTS:  Left main coronary artery:  Had a 70% proximal stenosis.   Left anterior descending artery:  Had a 90% proximal stenosis before the  first diagonal branch.  There was then competing flow in the distal LAD and  there was a stent in the proximal LAD after  the diagonal branch.   Circumflex artery:  Was completely occluded proximally.   Right coronary artery:  Completely occluded proximally.   The saphenous vein graft to the distal right coronary artery attached to the  posterior descending branch and filled the posterior descending branch and  three posterolateral branches.  There was irregularity and 30% narrowing in  the proximal portion of the vein graft.   The saphenous vein graft to the marginal branch of the circumflex artery had  a 95% stenosis in the proximal portion with disease extending to the ostium.  There was visible thrombus distal to the stenosis.   The saphenous vein graft to the diagonal branch was completely occluded.  This appeared to be an old occlusion.   The LIMA graft was patent and functioned normally.   LEFT VENTRICULOGRAM:  The left ventriculogram performed in the RAO  projection showed slight hypokinesis of the anterolateral wall.  The overall  wall motion was good with an estimated ejection fraction of 55%.   Following stenting  of the vein graft to the marginal branch of the  circumflex artery the stenosis improved from 95% to 0%.  The flow remained  TIMI 3 flow before and after intervention, although transiently it was TIMI  2 flow.   The aortic pressure was 107/57 with a mean of 84 and left ventricular  pressure was 107/32.   CONCLUSION:  1.  Coronary artery disease status post prior coronary artery bypass graft      surgery.  2.  Severe native vessel disease with 70% stenosis of the left main coronary      artery, 90% stenosis in the proximal left anterior descending, total      occlusion of the circumflex and right coronary artery.  3.  30% stenosis in the vein graft to the distal right coronary artery, 95%      stenosis in the vein graft to the marginal branch of the circumflex      artery, total occlusion of the vein graft to the diagonal branch of the      left anterior descending, and patent left internal mammary artery graft      to the left anterior descending.  4.  Slight anterior wall hypokinesis with an estimated ejection fraction of      55%.  5.  Successful stenting of the lesion in the ostial and proximal vein graft      to the marginal branch of the circumflex artery using distal protection      with the Spider with improvement in the center of narrowing from 95% to      0%.   DISPOSITION:  Patient returned to postanesthesia care unit for further  observation.           ______________________________  Charlies Constable, M.D. [facility]     BB/MEDQ  D:  02/23/2005  T:  02/23/2005  Job:  [id number]   cc:   Salvadore Farber, M.D. [facility]  [address]  [address]  [place]  [place] [address]   [facility] Lab

## 2010-09-02 NOTE — Assessment & Plan Note (Signed)
[facility]                             PULMONARY OFFICE NOTE   NAME:[name] [name]                         MRN:          [id number]  DATE:11/05/2006                            DOB:          [birth year]    CHIEF COMPLAINT:  Dyspnea.   HISTORY:  This is a 60 year old white male; active smoker, complaining  of dyspnea with activity. This has gradually worsened over the last  year.  He denies any exacerbations with waxing and waning in terms of  his symptoms.  He does have mild congestive cough that he wakes up with  every morning, with thick, white mucus that is worse in the winter.  However, he denies any classic exacerbations with acute deterioration,  or a need for inhalers.  He denies any fevers, chills, sweats, chest  pain or leg swelling.  He denies any nocturnal complaints.   PAST MEDICAL HISTORY:  1. Ischemic heart disease, status post coronary artery bypass grafting      in 1997.  The last left heart catheterization done November 2006,      requiring stenting of the ostial and proximal vein graft to the      marginal branch of the circumflex (by Dr. Charlies Constable).      Maintained, therefore, on Plavix and aspirin indefinitely.  2. Peripheral vascular disease with a 70% radially at 40% left renal      stenosis; previously documented.  3. Hyperlipidemia; target LDL less than 80 based on ischemic heart      disease and peripheral vascular disease.  4. History of asthmatic bronchitis.   ALLERGIES:  NONE KNOWN.   MEDICATIONS:  Plavix, Vytorin, aspirin, Lisinopril, vitamin C,  multivitamin.  He was also tried on Wellbutrin, which he said he could  not take and it did not help him reduce his cigarette smoking anyway.   FAMILY HISTORY:  Positive for ischemic heart disease at age 51 in his  father, who was not a smoker but lived to be 67.  He has one fraternal  twin brother who has had a stroke.  There is no family history of any  cancer of any  kind.   SOCIAL HISTORY:  He is still actively smoking, as noted above.  He is  self employed.   REVIEW OF SYSTEMS:  Taken in detail and on the worksheet.  Significant  for problems as outlined above.   PHYSICAL EXAMINATION:  GENERAL:  This is a stoic, ambulatory white male  who states he is only here because his wife insisted.  VITAL SIGNS:  He is afebrile.  Blood pressure 120/84.  Weight 189  pounds, with no change of baseline.  HEENT:  Reveals upper dentures in place.  Lower dentition is intact.  Ear canals revealed mild wax impaction.  NECK:  Supple without cervical adenopathy or tenderness.  Carotid  upstrokes are brisk without any bruits.  Trachea was midline.  CHEST:  Revealed diminished breath sounds, but no wheezing.  There is  regular rhythm without murmur, gallop or rub.  No increase in P2.  ABDOMEN:  Soft and benign, with no prominent organomegaly, hepatomegaly,  masses or tenderness, or obvious bruits.  Femoral pulses were present  bilaterally.  GENITOURINARY:  He has atrophy of the right testicle.  The prostate  revealed moderate BPH.  No nodules.  Stool guaiac was negative.  EXTREMITIES:  Warm without calf tenderness, clubbing, cyanosis or edema.  Pedal pulses were symmetrically reduced bilaterally.  NEUROLOGIC:  No focal deficits.  SKIN:  Warm and dry.   EKG:  Normal, except for rate with a sinus bradycardia at 50.   LABORATORY STUDIES:  CBC:  Normal.  Chemistry Profile:  Normal.  LFT:  Normal.  LDL Cholesterol:  106.  HDL 28.  TSH:  Normal.  Urinalysis:  Unremarkable.   CHEST X-RAY:  Showed mild chronic obstructive pulmonary disease changes  only.   IMPRESSION:  1. DYSPNEA.  This is slowly progressing over the last year.  Probably      represents the effects of chronic obstructive pulmonary disease,      with chronic bronchitis but no tenderness due to asthma      exacerbations.  I think the most important issue here is actually      to get him to commit to  quit smoking.  If he will do so I would      like him to see me 7 days before for a prescription of Chantix.  If      he won't, when he returns in 3 months we may consider starting him      on Chantix anyway for a trial basis, to see to what extent it cuts      down on his urge to smoke over a week or two.  However, without a      committments to quit smoking it is likely that his lung function      will continue to deteriorate, despite choice of medicines.  I have      explained this to him in detail.  2. HYPERLIPIDEMIA.  Note that his HDL was quite low and LDL was      relatively high on Vytorin, at 10/40 one daily.  He might benefit      from the addition of niacin.  In fact, it may make more sense to      switch him over to niacin in combination with a statin when he      returns for follow-up.  Regular exercise and the elimination of      smoking would also help in regards to his lipid profile.  3. GENERAL HEALTH MAINTENANCE.  He was updated on tetanus in 1998, and      therefore will need another one this year.  Pneumovax was updated      in 2001, and needs to be repeated this year.  He never kept the      appointment for colonoscopy, as recommended on his last      comprehensive evaluation in 2006.  He was only here this day      because of his wife's insistence. It is very unlikely that he is      going to comply with any of the recommendations in a timely enough      fashion to be of benefit.  I will also discuss this issue with him      when he returns; making sure he is making a truly informed decision      in terms of ignoring recommendations.  (My last note said  that he      was asked to return in 3 months because of all the loose ends.      This note was dictated on April 07, 2004; apparently he has not      adhered to it.  By the phone tree I will insist that if he wants to      continue to see me as his      primary physician, he will need to keep these appointments  or      consider an alternative care primary care physician.)     [name] Needle B. Sherene Sires, MD, [facility]  Electronically Signed    MBW/MedQ  DD: 11/05/2006  DT: 11/05/2006  Job #: [id number]

## 2010-09-07 ENCOUNTER — Encounter: Payer: Self-pay | Admitting: Internal Medicine

## 2010-09-16 ENCOUNTER — Ambulatory Visit (INDEPENDENT_AMBULATORY_CARE_PROVIDER_SITE_OTHER)
Admission: RE | Admit: 2010-09-16 | Discharge: 2010-09-16 | Disposition: A | Discharge status: Home / Self Care | Payer: Managed Care, Other (non HMO) | Source: Ambulatory Visit | Attending: Internal Medicine | Admitting: Internal Medicine

## 2010-09-16 ENCOUNTER — Other Ambulatory Visit: Payer: Self-pay | Admitting: Internal Medicine

## 2010-09-16 ENCOUNTER — Encounter: Payer: Self-pay | Admitting: Internal Medicine

## 2010-09-16 ENCOUNTER — Ambulatory Visit (INDEPENDENT_AMBULATORY_CARE_PROVIDER_SITE_OTHER): Payer: Managed Care, Other (non HMO) | Admitting: Internal Medicine

## 2010-09-16 ENCOUNTER — Other Ambulatory Visit (INDEPENDENT_AMBULATORY_CARE_PROVIDER_SITE_OTHER): Payer: Managed Care, Other (non HMO)

## 2010-09-16 DIAGNOSIS — Z0000 Encounter for general adult medical examination without abnormal findings: Secondary | ICD-10-CM

## 2010-09-16 DIAGNOSIS — I10 Essential (primary) hypertension: Secondary | ICD-10-CM

## 2010-09-16 DIAGNOSIS — E785 Hyperlipidemia, unspecified: Secondary | ICD-10-CM

## 2010-09-16 DIAGNOSIS — F17200 Nicotine dependence, unspecified, uncomplicated: Secondary | ICD-10-CM

## 2010-09-16 DIAGNOSIS — I259 Chronic ischemic heart disease, unspecified: Secondary | ICD-10-CM

## 2010-09-16 LAB — URINALYSIS
Bilirubin Urine: NEGATIVE
Hgb urine dipstick: NEGATIVE
Nitrite: NEGATIVE
Specific Gravity, Urine: 1.02 (ref 1.000–1.030)
Total Protein, Urine: NEGATIVE
Urine Glucose: NEGATIVE
Urobilinogen, UA: 0.2 (ref 0.0–1.0)

## 2010-09-16 LAB — BASIC METABOLIC PANEL
CO2: 28 mEq/L (ref 19–32)
Calcium: 9.3 mg/dL (ref 8.4–10.5)
Chloride: 108 mEq/L (ref 96–112)
Creatinine, Ser: 0.9 mg/dL (ref 0.4–1.5)
GFR: 89.98 mL/min (ref >60.00)
Glucose, Bld: 97 mg/dL (ref 70–99)
Sodium: 141 mEq/L (ref 135–145)

## 2010-09-16 LAB — HEPATIC FUNCTION PANEL
ALT: 15 U/L (ref 0–53)
AST: 20 U/L (ref 0–37)
Albumin: 3.8 g/dL (ref 3.5–5.2)
Total Protein: 6.9 g/dL (ref 6.0–8.3)

## 2010-09-16 LAB — LDL CHOLESTEROL, DIRECT: Direct LDL: 105.7 mg/dL

## 2010-09-16 LAB — PSA: PSA: 0.59 ng/mL (ref 0.10–4.00)

## 2010-09-16 LAB — HIGH SENSITIVITY CRP: CRP, High Sensitivity: 3.11 mg/L (ref 0.00–5.00)

## 2010-09-16 LAB — LIPID PANEL
Cholesterol: 166 mg/dL (ref 0–200)
HDL: 39.4 mg/dL (ref >39.00)
Triglycerides: 276 mg/dL — ABNORMAL HIGH (ref 0.0–149.0)

## 2010-09-16 NOTE — Progress Notes (Signed)
Subjective:     Patient ID: [name] [name], male   DOB: Apr 06, 1951, 60 y.o.   MRN: [id number]  HPI  44 yowm smoker with IHD/PVD no longer followed in Cardiology since Dr Samule Ohm left but still on plavix and asa s/p CABG '97 and stent in 206      09/17/2010  Ov/Wert cpx, no complaints, declines colonoscopy referral. Still smoking    Past Medical History:  HYPERLIPIDEMIA (ICD-272.4)  CAD (ICD-414.00)  - s/p CABG 1997  - LHC 02/23/2005 > stent of graft to cx, ef 55%  PERIPHERAL VASCULAR DISEASE (ICD-443.9)  - 12/09/04 carotid dopplers < 80% Right, < 60 Left  BRONCHITIS, CHRONIC (ICD-491.9)  HYPERTENSION  - ACE d/c June 25, 2009 (chronic cough)  HYPERLIPIDEMIA (ICD-272.4)  - Target LDL < 70 due to ASCVCD  HEALTH MAINTENANCE..............................................................Marland KitchenWert  -DT July 24, 2008  -Pneumovax (second shot) July 24, 2008  -CPX  09/16/10 -Colon screening rec 04/07/2004 > declined again  09/16/10 p discussion risks vs benefits  Family History:  MI younger brother (x2 y younger)dsf  MI Father @ 103   Social History:  Current smoker. Smokes average of 1/4 ppd.  works sharpening blades         Review of Systems  Constitutional: Negative for fever, chills, activity change, appetite change and unexpected weight change.  HENT: Positive for congestion. Negative for sore throat, rhinorrhea, sneezing, trouble swallowing, dental problem, voice change and postnasal drip.   Eyes: Negative for visual disturbance.  Respiratory: Negative for cough, choking and shortness of breath.   Cardiovascular: Negative for chest pain and leg swelling.  Gastrointestinal: Negative for nausea, vomiting and abdominal pain.  Genitourinary: Negative for difficulty urinating.  Musculoskeletal: Positive for arthralgias.  Skin: Negative for rash.  Psychiatric/Behavioral: Negative for behavioral problems and confusion.       Objective:   Physical Exam     wt  181 July 24, 2008 > 178  June 25, 2009  >  180 09/17/2010  HEENT: upper dentures, nl turbinates, and orophanx. Nl external ear canals without cough reflex  NECK : without JVD/Nodes/TM/ nl carotid upstrokes bilaterally  LUNGS: no acc muscle use, clear to A and P bilaterally without cough on insp or exp maneuvers  CV: RRR no s3 or murmur or increase in P2, no edema  ABD: soft and nontender with nl excursion in the supine position. No bruits or organomegaly, bowel sounds nl  MS: warm without deformities, calf tenderness, cyanosis or clubbing  SKIN: warm and dry without lesions  NEURO: alert, approp, no deficits  Testes atophy on right, up on L with no ih  Rectal mod  bph, smooth, stool g neg  cxr 09/16/10 Mild basilar interstitial prominence is chronic. No acute findings.   Assessment:         Plan:

## 2010-09-16 NOTE — Patient Instructions (Signed)
I recommend highly that you stop smoking and have colonoscopy to reduce your risk of cancer  We will call you with the results of your labs and whether or not you need to follow up with your cardiologist

## 2010-09-17 NOTE — Assessment & Plan Note (Signed)
Needs re-evaluation with ? Of how long to stay on Plavix and whether additonal screening needd

## 2010-09-17 NOTE — Assessment & Plan Note (Addendum)
Not at goal of LDL < 70 ? How compliant with Simcor  ?  Needs additional med rx  > defer to Cardiology  Needs to stop smoking as the greatest reversible risk factor

## 2010-09-17 NOTE — Assessment & Plan Note (Signed)
Discussed the risks and costs (both direct and indirect)  of smoking relative to the benefits of quitting but patient unwilling to commit at this point to a specific quit date.    Offered to help with quitting by use of chantix or referral to our Quit Smart program when the patient is ready.  

## 2010-09-17 NOTE — Assessment & Plan Note (Signed)
Adequate control on present rx, reviewed  

## 2010-09-23 ENCOUNTER — Encounter: Payer: Self-pay | Admitting: Internal Medicine

## 2010-09-23 NOTE — Progress Notes (Signed)
Quick Note:    Letter mailed.  ______

## 2010-09-29 ENCOUNTER — Other Ambulatory Visit: Payer: Self-pay | Admitting: *Deleted

## 2010-09-29 MED ORDER — NIACIN-SIMVASTATIN ER 500-20 MG PO TB24: 1.0000 | ORAL_TABLET | Freq: Every day | ORAL | Status: DC

## 2010-12-13 ENCOUNTER — Other Ambulatory Visit: Payer: Self-pay | Admitting: Internal Medicine

## 2010-12-13 MED ORDER — CLOPIDOGREL BISULFATE 75 MG PO TABS: 75.0000 mg | ORAL_TABLET | Freq: Every day | ORAL | Status: DC

## 2011-04-10 ENCOUNTER — Other Ambulatory Visit: Payer: Self-pay | Admitting: Allergy

## 2011-04-10 MED ORDER — SILDENAFIL CITRATE 50 MG PO TABS: 50.0000 mg | ORAL_TABLET | Freq: Every day | ORAL | Status: DC | PRN

## 2011-05-01 ENCOUNTER — Telehealth: Payer: Self-pay | Admitting: Internal Medicine

## 2011-05-01 NOTE — Telephone Encounter (Signed)
Spoke with wife and she staets pt does not need any refills at this time. Also decided they want furture fills sent to [facility] [address] Carron Curie, CMA

## 2011-05-01 NOTE — Telephone Encounter (Signed)
Pt returned triage's call.  Pt  needs to change pharmacies as well.  Pt's insurance will no longer allow pt to use [facility] & now must [facility]  [name] [name]

## 2011-05-01 NOTE — Telephone Encounter (Signed)
LMTCBx1 to see if refills need to be sent now and on what meds or was this just an FYI for future refills? Carron Curie, CMA

## 2011-05-09 ENCOUNTER — Telehealth: Payer: Self-pay | Admitting: Internal Medicine

## 2011-05-09 MED ORDER — NIACIN-SIMVASTATIN ER 500-20 MG PO TB24: 1.0000 | ORAL_TABLET | Freq: Every day | ORAL | Status: DC

## 2011-05-09 MED ORDER — CLOPIDOGREL BISULFATE 75 MG PO TABS: 75.0000 mg | ORAL_TABLET | Freq: Every day | ORAL | Status: DC

## 2011-05-09 NOTE — Telephone Encounter (Signed)
I spoke with spouse and she states pt needs 90 day supply of plavix and sim[facility] [address]. I advised will send rx and nothing further was needed. Rx's have been sent

## 2012-04-29 ENCOUNTER — Telehealth: Payer: Self-pay | Admitting: Internal Medicine

## 2012-04-29 DIAGNOSIS — E785 Hyperlipidemia, unspecified: Secondary | ICD-10-CM

## 2012-04-29 DIAGNOSIS — Z0000 Encounter for general adult medical examination without abnormal findings: Secondary | ICD-10-CM

## 2012-04-29 DIAGNOSIS — I10 Essential (primary) hypertension: Secondary | ICD-10-CM

## 2012-04-29 NOTE — Telephone Encounter (Signed)
Called, spoke with pt's spouse.  She is aware labs have been placed and ok for pt to come in anytime prior to appt on Thursday fasting for these to be done.  She verbalized understanding, will inform pt, and voiced no further questions or concerns at this time.

## 2012-04-29 NOTE — Telephone Encounter (Signed)
Fasting lipid profile, bmet, cbc, lfts, tsh, u/a

## 2012-04-29 NOTE — Telephone Encounter (Signed)
Called, spoke with pt's spouse.  States they have insurance through her employer.  But in order to receive a discount, pt will need to get a health assessment form filed out (wants to know pt's wt, ht, chol level, size of waist, and BP).  We have scheduled pt for an OV with MW on Thursday, Jan 16 at 4:30 pm -- wife states d/t pt's work schedule evening appts are better.  She would like to know if we can put in chol labs so pt can come in prior to have this done fasting.  Dr. Sherene Sires, pls advise if this is ok.  Thank you.

## 2012-05-01 ENCOUNTER — Other Ambulatory Visit (INDEPENDENT_AMBULATORY_CARE_PROVIDER_SITE_OTHER): Payer: Managed Care, Other (non HMO)

## 2012-05-01 DIAGNOSIS — Z0000 Encounter for general adult medical examination without abnormal findings: Secondary | ICD-10-CM

## 2012-05-01 DIAGNOSIS — E785 Hyperlipidemia, unspecified: Secondary | ICD-10-CM

## 2012-05-01 DIAGNOSIS — I10 Essential (primary) hypertension: Secondary | ICD-10-CM

## 2012-05-01 LAB — TSH: TSH: 2.08 u[IU]/mL (ref 0.35–5.50)

## 2012-05-01 LAB — CBC WITH DIFFERENTIAL/PLATELET
Basophils Absolute: 0 10*3/uL (ref 0.0–0.1)
Eosinophils Absolute: 0.1 10*3/uL (ref 0.0–0.7)
HCT: 48.5 % (ref 39.0–52.0)
Lymphs Abs: 1.5 10*3/uL (ref 0.7–4.0)
MCHC: 34.3 g/dL (ref 30.0–36.0)
MCV: 94.9 fl (ref 78.0–100.0)
Monocytes Absolute: 0.6 10*3/uL (ref 0.1–1.0)
Neutro Abs: 3.6 10*3/uL (ref 1.4–7.7)
Platelets: 229 10*3/uL (ref 150.0–400.0)
RDW: 12.6 % (ref 11.5–14.6)

## 2012-05-01 LAB — URINALYSIS, ROUTINE W REFLEX MICROSCOPIC
Bilirubin Urine: NEGATIVE
Hgb urine dipstick: NEGATIVE
Ketones, ur: NEGATIVE
Leukocytes, UA: NEGATIVE
Nitrite: NEGATIVE
Specific Gravity, Urine: 1.015 (ref 1.000–1.030)
Total Protein, Urine: NEGATIVE
Urine Glucose: NEGATIVE
Urobilinogen, UA: 0.2 (ref 0.0–1.0)
pH: 6.5 (ref 5.0–8.0)

## 2012-05-01 LAB — LIPID PANEL
Cholesterol: 164 mg/dL (ref 0–200)
VLDL: 26.6 mg/dL (ref 0.0–40.0)

## 2012-05-01 LAB — HEPATIC FUNCTION PANEL
Albumin: 4.2 g/dL (ref 3.5–5.2)
Total Bilirubin: 0.8 mg/dL (ref 0.3–1.2)

## 2012-05-01 LAB — BASIC METABOLIC PANEL
BUN: 14 mg/dL (ref 6–23)
CO2: 29 mEq/L (ref 19–32)
GFR: 73.45 mL/min (ref >60.00)
Glucose, Bld: 93 mg/dL (ref 70–99)
Potassium: 4.8 mEq/L (ref 3.5–5.1)

## 2012-05-02 ENCOUNTER — Ambulatory Visit (INDEPENDENT_AMBULATORY_CARE_PROVIDER_SITE_OTHER)
Admission: RE | Admit: 2012-05-02 | Discharge: 2012-05-02 | Disposition: A | Discharge status: Home / Self Care | Payer: Managed Care, Other (non HMO) | Source: Ambulatory Visit | Attending: Internal Medicine | Admitting: Internal Medicine

## 2012-05-02 ENCOUNTER — Encounter: Payer: Self-pay | Admitting: Internal Medicine

## 2012-05-02 ENCOUNTER — Ambulatory Visit (INDEPENDENT_AMBULATORY_CARE_PROVIDER_SITE_OTHER): Payer: Managed Care, Other (non HMO) | Admitting: Internal Medicine

## 2012-05-02 VITALS — BP 108/70 | HR 72 | Temp 97.5°F | Ht 67.0 in | Wt 177.8 lb

## 2012-05-02 DIAGNOSIS — I10 Essential (primary) hypertension: Secondary | ICD-10-CM

## 2012-05-02 DIAGNOSIS — F17200 Nicotine dependence, unspecified, uncomplicated: Secondary | ICD-10-CM

## 2012-05-02 DIAGNOSIS — E785 Hyperlipidemia, unspecified: Secondary | ICD-10-CM

## 2012-05-02 NOTE — Progress Notes (Signed)
Subjective:     Patient ID: [name] [name], male   DOB: [birth year]   MRN: [id number]    Brief patient profile:  51 yowm smoker with IHD/PVD no longer followed in Cardiology since Dr Samule Ohm left but still on plavix and asa s/p CABG '97 and stent in 206     HPI 09/17/2010  Ov/Wert cpx, no complaints, declines colonoscopy referral. Still smoking rec  05/02/2012 ov/Wert cc CPX, no complaints of limiting sob, still smoking,no cough   Sleeping ok without nocturnal  or early am exacerbation  of respiratory  c/o's or need for noct saba. Also denies any obvious fluctuation of symptoms with weather or environmental changes or other aggravating or alleviating factors except as outlined above   ROS  The following are not active complaints unless bolded sore throat, dysphagia, dental problems, itching, sneezing,  nasal congestion or excess/ purulent secretions, ear ache,   fever, chills, sweats, unintended wt loss, pleuritic or exertional cp, hemoptysis,  orthopnea pnd or leg swelling, presyncope, palpitations, heartburn, abdominal pain, anorexia, nausea, vomiting, diarrhea  or change in bowel or urinary habits, change in stools or urine, dysuria,hematuria,  rash, arthralgias, visual complaints, headache, numbness weakness or ataxia or problems with walking or coordination,  change in mood/affect or memory.        Past Medical History:  HYPERLIPIDEMIA (ICD-272.4)  CAD (ICD-414.00)  - s/p CABG 1997  - LHC 02/23/2005 > stent of graft to cx, ef 55%  PERIPHERAL VASCULAR DISEASE (ICD-443.9)  - 12/09/04 carotid dopplers < 80% Right, < 60 Left  BRONCHITIS, CHRONIC (ICD-491.9)  HYPERTENSION  - ACE d/c June 25, 2009 (chronic cough)  HYPERLIPIDEMIA (ICD-272.4)  - Target LDL < 70 due to ASCVCD  HEALTH MAINTENANCE..............................................................Marland KitchenWert  -DT July 24, 2008  -Pneumovax (second shot) July 24, 2008  -CPX  05/02/2012  -Colon screening rec 04/07/2004 > declined again   09/16/10 p discussion risks vs benefits  Family History:  MI younger brother (x2 y younger)dsf  MI Father @ 21   Social History:  Current smoker. Smokes average of 1/4 ppd.  works sharpening blades               Objective:   Physical Exam     wt  181 July 24, 2008 > 178 June 25, 2009  >  180 09/17/2010 > 177  05/02/2012  HEENT: upper dentures, nl turbinates, and orophanx. Nl external ear canals without cough reflex  NECK : without JVD/Nodes/TM/ nl carotid upstrokes bilaterally  LUNGS: very slt barrel shaped, no acc muscle use, clear to A and P bilaterally without cough on insp or exp maneuvers  CV: RRR no s3 or murmur or increase in P2, no edema  ABD: soft and nontender with nl excursion in the supine position. No bruits or organomegaly, bowel sounds nl  Ext  warm without deformities, calf tenderness, cyanosis or clubbing  SKIN: warm and dry without lesions  NEURO: alert, approp, no deficits  Testes atophy on right, up on L with no ih  Rectal mod  bph, smooth, stool g neg MS nl gait, no deformities or restrictions  CXR  05/02/2012 :   Stable chronic bronchitic changes   Assessment:         Plan:

## 2012-05-02 NOTE — Patient Instructions (Addendum)
Please remember to go to the  x-ray department downstairs for your tests - we will call you with the results when they are available.  The key is to stop smoking completely before smoking completely stops you - this is the most important aspect of your care.

## 2012-05-02 NOTE — Assessment & Plan Note (Signed)
Adequate control on present rx, reviewed  

## 2012-05-02 NOTE — Assessment & Plan Note (Signed)
Discussed, not ready to commit to quit

## 2012-05-02 NOTE — Assessment & Plan Note (Addendum)
-   Goal LDL < 70 due to CAD   Lab Results  Component Value Date   CHOL 164 05/01/2012   HDL 37.60* 05/01/2012   LDLCALC 100* 05/01/2012   LDLDIRECT 105.7 09/16/2010   TRIG 133.0 05/01/2012   CHOLHDL 4 05/01/2012   close enough to goal  to continue present rx - informed stopping smoking more important

## 2012-05-10 NOTE — Progress Notes (Signed)
Quick Note:  lmomtcb ______ 

## 2012-05-10 NOTE — Progress Notes (Signed)
Quick Note:  Spoke with pt and notified of results per Dr. Wert. Pt verbalized understanding and denied any questions.  ______ 

## 2012-05-27 ENCOUNTER — Other Ambulatory Visit: Payer: Self-pay | Admitting: Internal Medicine

## 2013-01-06 ENCOUNTER — Telehealth: Payer: Self-pay | Admitting: Internal Medicine

## 2013-01-06 DIAGNOSIS — R21 Rash and other nonspecific skin eruption: Secondary | ICD-10-CM

## 2013-01-06 NOTE — Telephone Encounter (Signed)
I spoke with spouse. She stated pt had a red ring on his forehead-like a rash. It had cleared up and now has one on the left side and some on cheek. She is wanting to know who dr. Sherene Sires would rec he see as a dermatologists. Please advise MW thanks  --she is aware he has left for the day

## 2013-01-06 NOTE — Telephone Encounter (Signed)
Refer to Dr Terri Piedra but if there's much delay then have him come see Tammy NP in meantime

## 2013-01-07 NOTE — Telephone Encounter (Signed)
Called, spoke with pt's wife.  Informed her of below recs per MW.  She verbalized understanding of this.  She is aware they will receive call back with appt date with Dr. Terri Piedra and to schedule OV with TP if there is much delay for appt.    Will route msg to PCCs.  Please help with this. Thank you.

## 2013-01-08 NOTE — Telephone Encounter (Signed)
Pt has appt to see dr Sharyn Lull 01/10/13@11 :30am pt's wife aware Tobe Sos

## 2013-07-28 ENCOUNTER — Other Ambulatory Visit: Payer: Self-pay | Admitting: Internal Medicine

## 2013-11-18 ENCOUNTER — Other Ambulatory Visit: Payer: Self-pay | Admitting: Internal Medicine

## 2014-05-19 ENCOUNTER — Other Ambulatory Visit: Payer: Self-pay | Admitting: Internal Medicine

## 2014-05-30 ENCOUNTER — Other Ambulatory Visit: Payer: Self-pay | Admitting: Internal Medicine

## 2014-06-01 ENCOUNTER — Telehealth: Payer: Self-pay | Admitting: Internal Medicine

## 2014-06-01 NOTE — Telephone Encounter (Signed)
Called and spoke with pts wife and she stated that the pt recently had a full check up for him to be put on her insurance.  He will bring copy of all of the labs at this time.  appt scheduled for 2/16.  She is aware.

## 2014-06-02 ENCOUNTER — Ambulatory Visit (INDEPENDENT_AMBULATORY_CARE_PROVIDER_SITE_OTHER)
Admission: RE | Admit: 2014-06-02 | Discharge: 2014-06-02 | Disposition: A | Discharge status: Home / Self Care | Payer: Managed Care, Other (non HMO) | Source: Ambulatory Visit | Attending: Internal Medicine | Admitting: Internal Medicine

## 2014-06-02 ENCOUNTER — Ambulatory Visit (INDEPENDENT_AMBULATORY_CARE_PROVIDER_SITE_OTHER): Payer: Managed Care, Other (non HMO) | Admitting: Internal Medicine

## 2014-06-02 ENCOUNTER — Encounter: Payer: Self-pay | Admitting: Internal Medicine

## 2014-06-02 ENCOUNTER — Other Ambulatory Visit (INDEPENDENT_AMBULATORY_CARE_PROVIDER_SITE_OTHER): Payer: Managed Care, Other (non HMO)

## 2014-06-02 VITALS — BP 150/90 | HR 65 | Temp 97.6°F | Ht 67.0 in | Wt 175.0 lb

## 2014-06-02 DIAGNOSIS — R05 Cough: Secondary | ICD-10-CM

## 2014-06-02 DIAGNOSIS — I259 Chronic ischemic heart disease, unspecified: Secondary | ICD-10-CM

## 2014-06-02 DIAGNOSIS — I10 Essential (primary) hypertension: Secondary | ICD-10-CM

## 2014-06-02 DIAGNOSIS — R059 Cough, unspecified: Secondary | ICD-10-CM

## 2014-06-02 DIAGNOSIS — F17200 Nicotine dependence, unspecified, uncomplicated: Secondary | ICD-10-CM

## 2014-06-02 DIAGNOSIS — E785 Hyperlipidemia, unspecified: Secondary | ICD-10-CM

## 2014-06-02 DIAGNOSIS — J449 Chronic obstructive pulmonary disease, unspecified: Secondary | ICD-10-CM | POA: Insufficient documentation

## 2014-06-02 DIAGNOSIS — Z720 Tobacco use: Secondary | ICD-10-CM

## 2014-06-02 LAB — CBC WITH DIFFERENTIAL/PLATELET
BASOS ABS: 0 10*3/uL (ref 0.0–0.1)
Basophils Relative: 0.4 % (ref 0.0–3.0)
EOS PCT: 0.9 % (ref 0.0–5.0)
Eosinophils Absolute: 0.1 10*3/uL (ref 0.0–0.7)
HCT: 50 % (ref 39.0–52.0)
Hemoglobin: 17.2 g/dL — ABNORMAL HIGH (ref 13.0–17.0)
LYMPHS PCT: 21.5 % (ref 12.0–46.0)
Lymphs Abs: 1.5 10*3/uL (ref 0.7–4.0)
MCHC: 34.5 g/dL (ref 30.0–36.0)
MCV: 95.2 fl (ref 78.0–100.0)
MONOS PCT: 7.8 % (ref 3.0–12.0)
Monocytes Absolute: 0.6 10*3/uL (ref 0.1–1.0)
Neutro Abs: 5 10*3/uL (ref 1.4–7.7)
Neutrophils Relative %: 69.4 % (ref 43.0–77.0)
Platelets: 230 10*3/uL (ref 150.0–400.0)
RBC: 5.26 Mil/uL (ref 4.22–5.81)
RDW: 12.9 % (ref 11.5–15.5)
WBC: 7.2 10*3/uL (ref 4.0–10.5)

## 2014-06-02 LAB — BASIC METABOLIC PANEL
BUN: 9 mg/dL (ref 6–23)
CO2: 30 mEq/L (ref 19–32)
Calcium: 9.9 mg/dL (ref 8.4–10.5)
Chloride: 104 mEq/L (ref 96–112)
Creatinine, Ser: 1.01 mg/dL (ref 0.40–1.50)
GFR: 78.83 mL/min (ref >60.00)
Glucose, Bld: 104 mg/dL — ABNORMAL HIGH (ref 70–99)
POTASSIUM: 4.5 meq/L (ref 3.5–5.1)
SODIUM: 138 meq/L (ref 135–145)

## 2014-06-02 LAB — HEPATIC FUNCTION PANEL
ALBUMIN: 4.4 g/dL (ref 3.5–5.2)
ALK PHOS: 55 U/L (ref 39–117)
ALT: 12 U/L (ref 0–53)
AST: 19 U/L (ref 0–37)
Bilirubin, Direct: 0.1 mg/dL (ref 0.0–0.3)
TOTAL PROTEIN: 7.9 g/dL (ref 6.0–8.3)
Total Bilirubin: 0.8 mg/dL (ref 0.2–1.2)

## 2014-06-02 LAB — TSH: TSH: 1.8 u[IU]/mL (ref 0.35–4.50)

## 2014-06-02 NOTE — Assessment & Plan Note (Signed)
Not Adequate control on present rx, reviewed > not clear what if anything he's taking but was previously on ACEi per our records, will hold all new rx until returns for cpx if wishes to continue primary care here

## 2014-06-02 NOTE — Assessment & Plan Note (Addendum)
-   Goal LDL < 70 due to CAD  Not clear he's been on simcor consistently as we have not been refilling it   LDL 94 on 05/26/14 with HDL 43 ? Dose of statin? > return with all meds at next ov to regroup/

## 2014-06-02 NOTE — Progress Notes (Signed)
Quick Note:  Spoke with pt and notified of results per Dr. Wert. Pt verbalized understanding and denied any questions.  ______ 

## 2014-06-02 NOTE — Assessment & Plan Note (Signed)
C/w cb from smoking but Not limited by breathing from desired activities  > no recs except commit to quit smoking

## 2014-06-02 NOTE — Assessment & Plan Note (Signed)

## 2014-06-02 NOTE — Assessment & Plan Note (Signed)
-   s/p CABG 1997  - LHC 02/23/2005 > stent of graft to cx, ef 55%    -Referred  back 09/17/2010 > no evidence that he ever went   Will discuss at cpx/ needs f/u

## 2014-06-02 NOTE — Patient Instructions (Signed)
No change in medications for now  Please remember to go to the lab and x-ray department downstairs for your tests - we will call you with the results when they are available.     Return at your convenience for cpx but bring all medications with you - you do not have to be fasting for this as we've done what you need today

## 2014-06-02 NOTE — Progress Notes (Signed)
Subjective:     Patient ID: [name] [name], male   DOB: June 24, 1950   MRN: [id number]    Brief patient profile:  44 yowm active smoker with IHD/PVD no longer followed in Cardiology since [name] [name] left but still on plavix and asa s/p CABG '97 and stent in 2006      HPI 09/17/2010  Ov/[name] [name] cpx, no complaints, declines colonoscopy referral. Still smoking rec  05/02/2012 ov/[name] [name] cc CPX, no complaints of limiting sob, still smoking,no cough rec   06/02/2014 f/u ov/[name] [name] re: hbp/ hyperlipidemia/not sure what meds he's on/ only returned because we wouldn't continue to fill them but should have run out sometime last year.  Chief Complaint  Patient presents with  . Follow-up    Pt here for med refills pt denies sob, chest tightness, wheeze and or cough.   some am cough/ active smoker with am production of a tsp or two mucoid worse in winter  Not limited by breathing from desired activities    No obvious day to day or daytime variabilty or assoc chronic cough or cp or chest tightness, subjective wheeze overt sinus or hb symptoms. No unusual exp hx or h/o childhood pna/ asthma or knowledge of premature birth.  Sleeping ok without nocturnal  or early am exacerbation  of respiratory  c/o's or need for noct saba. Also denies any obvious fluctuation of symptoms with weather or environmental changes or other aggravating or alleviating factors except as outlined above   Current Medications, Allergies, Complete Past Medical History, Past Surgical History, Family History, and Social History were reviewed in [employer] record.  ROS  The following are not active complaints unless bolded sore throat, dysphagia, dental problems, itching, sneezing,  nasal congestion or excess/ purulent secretions, ear ache,   fever, chills, sweats, unintended wt loss, pleuritic or exertional cp, hemoptysis,  orthopnea pnd or leg swelling, presyncope, palpitations, heartburn, abdominal pain, anorexia, nausea,  vomiting, diarrhea  or change in bowel or urinary habits, change in stools or urine, dysuria,hematuria,  rash, arthralgias, visual complaints, headache, numbness weakness or ataxia or problems with walking or coordination,  change in mood/affect or memory.         Past Medical History:  HYPERLIPIDEMIA (ICD-272.4)  CAD (ICD-414.00)  - s/p CABG 1997  - LHC 02/23/2005 > stent of graft to cx, ef 55%  PERIPHERAL VASCULAR DISEASE (ICD-443.9)  - 12/09/04 carotid dopplers < 80% Right, < 60 Left  BRONCHITIS, CHRONIC (ICD-491.9)  HYPERTENSION  - ACE d/c June 25, 2009 (chronic cough)  HYPERLIPIDEMIA (ICD-272.4)  - Target LDL < 70 due to [place]..............................................................Marland KitchenWert  -DT July 24, 2008  -Pneumovax (second shot) July 24, 2008  -CPX  05/02/2012  -Colon screening rec 04/07/2004 > declined again  09/16/10 p discussion risks vs benefits  Family History:  MI younger brother (x2 y younger) dsf  MI Father @ 88   Social History:  Current smoker. Smokes average of 1/4 ppd.  works sharpening blades         Objective:   Physical Exam     wt  181 July 24, 2008 > 178 June 25, 2009  >  180 09/17/2010 > 177  05/02/2012 > 06/02/2014   175   HEENT: upper dentures, nl turbinates, and orophanx. Nl external ear canals without cough reflex  NECK : without JVD/Nodes/TM/ nl carotid upstrokes bilaterally  LUNGS: very slt barrel shaped, no acc muscle use, clear to A and P bilaterally without cough on insp  or exp maneuvers  CV: RRR no s3 or murmur or increase in P2, no edema  ABD: soft and nontender with nl excursion in the supine position. No bruits or organomegaly, bowel sounds nl  Ext  warm without deformities, calf tenderness, cyanosis or clubbing  SKIN: warm and dry without lesions  NEURO: alert, approp, no deficits          Labs ordered today / images reviewed include:   chest X-ray  1. Prominent perihilar markings with peribronchial  thickening most consistent with bronchitis, most likely chronic. 2. Stable mild cardiomegaly.   Recent Labs Lab 06/02/14 1012  NA 138  K 4.5  CL 104  CO2 30  BUN 9  CREATININE 1.01  GLUCOSE 104*    Recent Labs Lab 06/02/14 1012  HGB 17.2*  HCT 50.0  WBC 7.2  PLT 230.0     Lab Results  Component Value Date   TSH 1.80 06/02/2014      Assessment:

## 2014-07-03 ENCOUNTER — Telehealth: Payer: Self-pay | Admitting: Internal Medicine

## 2014-07-03 DIAGNOSIS — E785 Hyperlipidemia, unspecified: Secondary | ICD-10-CM

## 2014-07-03 DIAGNOSIS — I10 Essential (primary) hypertension: Secondary | ICD-10-CM

## 2014-07-03 DIAGNOSIS — R059 Cough, unspecified: Secondary | ICD-10-CM

## 2014-07-03 DIAGNOSIS — R05 Cough: Secondary | ICD-10-CM

## 2014-07-03 NOTE — Telephone Encounter (Signed)
Patients wife called and would like Dr. Melvyn Novas to order his labs prior to seeing patient.  She says that Dr. Melvyn Novas has done this for him in the past.  He just wants to go ahead and get the labs drawn so he can have his coffee.    MW - please advise.

## 2014-07-07 NOTE — Telephone Encounter (Signed)
Can we order labs before seeing this pt? Please advise. Thanks.

## 2014-07-19 NOTE — Telephone Encounter (Signed)
Hyperlipidemia : lipid profile, tsh, lfts  hbp  bmet u/a  Cough :  cxr , cbc with diff

## 2014-07-20 NOTE — Telephone Encounter (Signed)
LMTCB for pt and for EC, Jeannine Kitten.

## 2014-07-21 NOTE — Telephone Encounter (Signed)
[facility] for pt at home number and for [facility] Jeannine Kitten at her work number.

## 2014-07-22 NOTE — Telephone Encounter (Signed)
Spoke with pts wife, she is aware that pt needs cxr and labs.  Plan on coming the day before visit to get these.   Labs and cxr ordered.  Nothing further needed.

## 2014-07-22 NOTE — Telephone Encounter (Signed)
[phone], pt wife cb

## 2014-07-22 NOTE — Telephone Encounter (Addendum)
lmtcb x3 

## 2014-08-12 ENCOUNTER — Other Ambulatory Visit: Payer: Managed Care, Other (non HMO)

## 2014-08-12 ENCOUNTER — Other Ambulatory Visit (INDEPENDENT_AMBULATORY_CARE_PROVIDER_SITE_OTHER): Payer: Managed Care, Other (non HMO)

## 2014-08-12 DIAGNOSIS — E785 Hyperlipidemia, unspecified: Secondary | ICD-10-CM

## 2014-08-12 DIAGNOSIS — I10 Essential (primary) hypertension: Secondary | ICD-10-CM

## 2014-08-12 DIAGNOSIS — R05 Cough: Secondary | ICD-10-CM

## 2014-08-12 LAB — CBC WITH DIFFERENTIAL/PLATELET
BASOS PCT: 0.4 % (ref 0.0–3.0)
Basophils Absolute: 0 10*3/uL (ref 0.0–0.1)
EOS PCT: 2 % (ref 0.0–5.0)
Eosinophils Absolute: 0.1 10*3/uL (ref 0.0–0.7)
HCT: 48 % (ref 39.0–52.0)
HEMOGLOBIN: 16.4 g/dL (ref 13.0–17.0)
Lymphocytes Relative: 21.6 % (ref 12.0–46.0)
Lymphs Abs: 1.7 10*3/uL (ref 0.7–4.0)
MCHC: 34.3 g/dL (ref 30.0–36.0)
MCV: 95.3 fl (ref 78.0–100.0)
MONO ABS: 0.6 10*3/uL (ref 0.1–1.0)
Monocytes Relative: 7.3 % (ref 3.0–12.0)
NEUTROS PCT: 68.7 % (ref 43.0–77.0)
Neutro Abs: 5.3 10*3/uL (ref 1.4–7.7)
Platelets: 221 10*3/uL (ref 150.0–400.0)
RBC: 5.03 Mil/uL (ref 4.22–5.81)
RDW: 13 % (ref 11.5–15.5)
WBC: 7.7 10*3/uL (ref 4.0–10.5)

## 2014-08-12 LAB — LIPID PANEL
CHOL/HDL RATIO: 4
CHOLESTEROL: 154 mg/dL (ref 0–200)
HDL: 41.5 mg/dL (ref >39.00)
LDL Cholesterol: 89 mg/dL (ref 0–99)
NONHDL: 112.5
Triglycerides: 117 mg/dL (ref 0.0–149.0)
VLDL: 23.4 mg/dL (ref 0.0–40.0)

## 2014-08-12 LAB — URINALYSIS, ROUTINE W REFLEX MICROSCOPIC
Bilirubin Urine: NEGATIVE
Hgb urine dipstick: NEGATIVE
KETONES UR: NEGATIVE
Leukocytes, UA: NEGATIVE
Nitrite: NEGATIVE
PH: 6 (ref 5.0–8.0)
RBC / HPF: NONE SEEN (ref 0–?)
Specific Gravity, Urine: 1.02 (ref 1.000–1.030)
TOTAL PROTEIN, URINE-UPE24: NEGATIVE
Urine Glucose: NEGATIVE
Urobilinogen, UA: 0.2 (ref 0.0–1.0)
WBC, UA: NONE SEEN (ref 0–?)

## 2014-08-12 LAB — BASIC METABOLIC PANEL
BUN: 12 mg/dL (ref 6–23)
CALCIUM: 9.6 mg/dL (ref 8.4–10.5)
CO2: 29 mEq/L (ref 19–32)
CREATININE: 1.57 mg/dL — AB (ref 0.40–1.50)
Chloride: 104 mEq/L (ref 96–112)
GFR: 47.35 mL/min — AB (ref >60.00)
GLUCOSE: 99 mg/dL (ref 70–99)
Potassium: 4.6 mEq/L (ref 3.5–5.1)
Sodium: 138 mEq/L (ref 135–145)

## 2014-08-12 LAB — TSH: TSH: 1.74 u[IU]/mL (ref 0.35–4.50)

## 2014-08-12 LAB — HEPATIC FUNCTION PANEL
ALBUMIN: 4.3 g/dL (ref 3.5–5.2)
ALK PHOS: 55 U/L (ref 39–117)
ALT: 39 U/L (ref 0–53)
AST: 20 U/L (ref 0–37)
Bilirubin, Direct: 0.1 mg/dL (ref 0.0–0.3)
TOTAL PROTEIN: 7.1 g/dL (ref 6.0–8.3)
Total Bilirubin: 0.4 mg/dL (ref 0.2–1.2)

## 2014-08-12 NOTE — Progress Notes (Signed)
Quick Note:  LMOM with results ______ 

## 2014-08-13 ENCOUNTER — Ambulatory Visit (INDEPENDENT_AMBULATORY_CARE_PROVIDER_SITE_OTHER): Payer: Medicare Other | Admitting: Internal Medicine

## 2014-08-13 ENCOUNTER — Encounter: Payer: Self-pay | Admitting: Internal Medicine

## 2014-08-13 ENCOUNTER — Ambulatory Visit: Payer: Managed Care, Other (non HMO) | Admitting: Internal Medicine

## 2014-08-13 ENCOUNTER — Encounter (INDEPENDENT_AMBULATORY_CARE_PROVIDER_SITE_OTHER): Payer: Self-pay

## 2014-08-13 VITALS — BP 118/80 | HR 67 | Ht 69.0 in | Wt 173.0 lb

## 2014-08-13 DIAGNOSIS — N183 Chronic kidney disease, stage 3 unspecified: Secondary | ICD-10-CM

## 2014-08-13 DIAGNOSIS — I259 Chronic ischemic heart disease, unspecified: Secondary | ICD-10-CM | POA: Diagnosis not present

## 2014-08-13 DIAGNOSIS — I10 Essential (primary) hypertension: Secondary | ICD-10-CM | POA: Diagnosis not present

## 2014-08-13 DIAGNOSIS — Z23 Encounter for immunization: Secondary | ICD-10-CM

## 2014-08-13 DIAGNOSIS — E785 Hyperlipidemia, unspecified: Secondary | ICD-10-CM | POA: Diagnosis not present

## 2014-08-13 DIAGNOSIS — F17210 Nicotine dependence, cigarettes, uncomplicated: Secondary | ICD-10-CM

## 2014-08-13 DIAGNOSIS — F17200 Nicotine dependence, unspecified, uncomplicated: Secondary | ICD-10-CM

## 2014-08-13 DIAGNOSIS — N189 Chronic kidney disease, unspecified: Secondary | ICD-10-CM

## 2014-08-13 MED ORDER — SULINDAC 200 MG PO TABS: ORAL_TABLET | ORAL | Status: DC

## 2014-08-13 NOTE — Patient Instructions (Addendum)
Prevnar 13 due today   Stop all the over counter medications except tylenol  For arthritis /joint pain >>  Sulindac 200 mg twice daily with meals as needed   The key is to stop smoking completely before smoking completely stops you!   Please schedule a follow up office visit in 6 weeks, call sooner if needed to recheck kidney function but no need to be fasting

## 2014-08-13 NOTE — Progress Notes (Signed)
Subjective:     Patient ID: [name] [name], male   DOB: [birth year]   MRN: [id number]    Brief patient profile:  89 yowm active smoker with IHD/PVD no longer followed in Cardiology since [name] [name] left but still on plavix and asa s/p CABG '97 and stent in 2006      HPI 09/17/2010  Ov/[name] [name] cpx, no complaints, declines colonoscopy referral. Still smoking rec  05/02/2012 ov/[name] [name] cc CPX, no complaints of limiting sob, still smoking,no cough rec    06/02/2014 f/u ov/[name] [name] re: hbp/ hyperlipidemia/not sure what meds he's on/ only returned because we wouldn't continue to fill them but should have run out sometime last year.  Chief Complaint  Patient presents with  . Follow-up    Pt here for med refills pt denies sob, chest tightness, wheeze and or cough.  some am cough/ active smoker with am production of a tsp or two mucoid worse in winter Not limited by breathing from desired activities    08/13/2014 f/u ov/[name] [name] re: CRI ? Etiology new since last ov/ cb still smoking  Chief Complaint  Patient presents with  . Annual Exam    Labs done 08/12/14. No co's today.    taking nsaids prn    no problem with urination  Denies sob   No obvious day to day or daytime variabilty in couging/ no  purulent sputum  cp or chest tightness, subjective wheeze overt sinus or hb symptoms. No unusual exp hx or h/o childhood pna/ asthma or knowledge of premature birth.  Sleeping ok without nocturnal  or early am exacerbation  of respiratory  c/o's or need for noct saba. Also denies any obvious fluctuation of symptoms with weather or environmental changes or other aggravating or alleviating factors except as outlined above   Current Medications, Allergies, Complete Past Medical History, Past Surgical History, Family History, and Social History were reviewed in [employer] record.  ROS  The following are not active complaints unless bolded sore throat, dysphagia, dental problems, itching, sneezing,   nasal congestion or excess/ purulent secretions, ear ache,   fever, chills, sweats, unintended wt loss, pleuritic or exertional cp, hemoptysis,  orthopnea pnd or leg swelling, presyncope, palpitations, heartburn, abdominal pain, anorexia, nausea, vomiting, diarrhea  or change in bowel or urinary habits, change in stools or urine, dysuria,hematuria,  rash, arthralgias, visual complaints, headache, numbness weakness or ataxia or problems with walking or coordination,  change in mood/affect or memory.         Past Medical History:  HYPERLIPIDEMIA (ICD-272.4)  CAD (ICD-414.00)  - s/p CABG 1997  - LHC 02/23/2005 > stent of graft to cx, ef 55%  PERIPHERAL VASCULAR DISEASE (ICD-443.9)  - 12/09/04 carotid dopplers < 80% Right, < 60 Left  BRONCHITIS, CHRONIC (ICD-491.9)  HYPERTENSION  - ACE d/c June 25, 2009 (chronic cough)  HYPERLIPIDEMIA (ICD-272.4)  - Target LDL < 70 due to [name] [name]..............................................................Marland KitchenWert  -DT July 24, 2008  -Pneumovax (second shot) July 24, 2008  Prevnar13 08/13/2014  -CPX  08/13/2014  -Colon screening rec 04/07/2004 > declined again  09/16/10 p discussion risks vs benefits    Family History:  MI younger brother (x2 y younger) dsf  MI Father @ 54   Social History:  Current smoker. Smokes average of 1/4 ppd.  works sharpening blades         Objective:   Physical Exam   amb wm/ nad/ vital signs reviewed    wt  181 July 24, 2008 > 178 June 25, 2009  >  180 09/17/2010 > 177  05/02/2012 > 06/02/2014   175 > 08/13/2014  173  HEENT: upper dentures, nl turbinates, and orophanx. Nl external ear canals without cough reflex  NECK : without JVD/Nodes/TM/ nl carotid upstrokes bilaterally  LUNGS: very slt barrel shaped, no acc muscle use, clear to A and P bilaterally without cough on insp or exp maneuvers  CV: RRR no s3 or murmur or increase in P2, no edema  ABD: soft and nontender with nl excursion in the supine  position. No bruits or organomegaly, bowel sounds nl  Ext  warm without deformities, calf tenderness, cyanosis or clubbing  SKIN: warm and dry without lesions  NEURO: alert, approp, no deficits  GU R testicle atrophic Rectal mod bph smooth, rubbery, no nodules G neg          Labs ordered today / images reviewed include:   chest X-ray 06/02/14  1. Prominent perihilar markings with peribronchial thickening most consistent with bronchitis, most likely chronic. 2. Stable mild cardiomegaly.    Labs ordered/ reviewed   Lab 08/12/14 0809  NA 138  K 4.6  CL 104  CO2 29  BUN 12  CREATININE 1.57*  GLUCOSE 99    Recent Labs Lab 08/12/14 0809  HGB 16.4  HCT 48.0  WBC 7.7  PLT 221.0     Lab Results  Component Value Date   TSH 1.74 08/12/2014             Assessment:

## 2014-08-14 NOTE — Progress Notes (Signed)
Quick Note:  Pt aware ______ 

## 2014-08-15 DIAGNOSIS — N183 Chronic kidney disease, stage 3 unspecified: Secondary | ICD-10-CM | POA: Insufficient documentation

## 2014-08-15 NOTE — Assessment & Plan Note (Addendum)
Estimated Creatinine Clearance: 46.9 mL/min (by C-G formula based on Cr of 1.57).  Etiologies include hbp and use of nsaids > rec avoid all except sulindac which spares renal prostacyclins >> and recheck in 6 weeks

## 2014-08-15 NOTE — Assessment & Plan Note (Signed)

## 2014-08-15 NOTE — Assessment & Plan Note (Signed)
-   Goal LDL < 70 due to CAD  Lab Results  Component Value Date   CHOL 154 08/12/2014   HDL 41.50 08/12/2014   LDLCALC 89 08/12/2014   LDLDIRECT 105.7 09/16/2010   TRIG 117.0 08/12/2014   CHOLHDL 4 08/12/2014     Adequate control on present rx, reviewed > no change in rx needed  - simcor 500-20 daily

## 2014-08-15 NOTE — Assessment & Plan Note (Signed)
Adequate control on present rx, reviewed > no change in rx needed  = diet

## 2014-09-09 ENCOUNTER — Telehealth: Payer: Self-pay | Admitting: Internal Medicine

## 2014-09-09 NOTE — Telephone Encounter (Signed)
Pt is currently taking Simcor 500-20mg  at bedtime. This medication is no longer being made according to pt's wife. He will need an alternative medication.  MW - please advise. Thanks.

## 2014-09-09 NOTE — Telephone Encounter (Signed)
Niacin 500 mg one daily x 1 y Simvastatin 20 mg daily x 1 y  If not able to tolerate in separate doses just stop until next ov which is coming up soon anyway - either way be sure he brings all meds to [facility]

## 2014-09-09 NOTE — Telephone Encounter (Signed)
lmtcb x1 for pt's wife, Harmon Pier.

## 2014-09-10 MED ORDER — SIMVASTATIN 20 MG PO TABS: 20.0000 mg | ORAL_TABLET | Freq: Every day | ORAL | Status: DC

## 2014-09-10 MED ORDER — NIACIN ER (ANTIHYPERLIPIDEMIC) 500 MG PO TBCR: 500.0000 mg | EXTENDED_RELEASE_TABLET | Freq: Every day | ORAL | Status: DC

## 2014-09-10 NOTE — Telephone Encounter (Signed)
Spouse aware of recs. RX's sent in. Nothing further needed

## 2014-09-10 NOTE — Telephone Encounter (Signed)
LMTCB for Harmon Pier [place]og[place]-pt's wife.

## 2014-09-10 NOTE — Telephone Encounter (Signed)
[phone], pt wife cb, states number we have been calling in not a valid

## 2014-09-23 ENCOUNTER — Other Ambulatory Visit: Payer: Self-pay | Admitting: Internal Medicine

## 2014-09-23 ENCOUNTER — Other Ambulatory Visit (INDEPENDENT_AMBULATORY_CARE_PROVIDER_SITE_OTHER): Payer: Medicare Other

## 2014-09-23 DIAGNOSIS — N189 Chronic kidney disease, unspecified: Secondary | ICD-10-CM

## 2014-09-23 DIAGNOSIS — N183 Chronic kidney disease, stage 3 unspecified: Secondary | ICD-10-CM

## 2014-09-23 LAB — BASIC METABOLIC PANEL
BUN: 13 mg/dL (ref 6–23)
CO2: 29 mEq/L (ref 19–32)
CREATININE: 1.05 mg/dL (ref 0.40–1.50)
Calcium: 9.7 mg/dL (ref 8.4–10.5)
Chloride: 101 mEq/L (ref 96–112)
GFR: 75.3 mL/min (ref >60.00)
Glucose, Bld: 111 mg/dL — ABNORMAL HIGH (ref 70–99)
Potassium: 4.4 mEq/L (ref 3.5–5.1)
Sodium: 136 mEq/L (ref 135–145)

## 2014-09-23 NOTE — Progress Notes (Signed)
Quick Note:  Will discuss at [facility] 09/24/14 ______

## 2014-09-24 ENCOUNTER — Ambulatory Visit (INDEPENDENT_AMBULATORY_CARE_PROVIDER_SITE_OTHER): Payer: Medicare Other | Admitting: Internal Medicine

## 2014-09-24 ENCOUNTER — Encounter: Payer: Self-pay | Admitting: Internal Medicine

## 2014-09-24 VITALS — BP 122/70 | HR 70 | Ht 69.0 in | Wt 172.0 lb

## 2014-09-24 DIAGNOSIS — F528 Other sexual dysfunction not due to a substance or known physiological condition: Secondary | ICD-10-CM

## 2014-09-24 DIAGNOSIS — R059 Cough, unspecified: Secondary | ICD-10-CM

## 2014-09-24 DIAGNOSIS — F17210 Nicotine dependence, cigarettes, uncomplicated: Secondary | ICD-10-CM | POA: Diagnosis not present

## 2014-09-24 DIAGNOSIS — I259 Chronic ischemic heart disease, unspecified: Secondary | ICD-10-CM

## 2014-09-24 DIAGNOSIS — N189 Chronic kidney disease, unspecified: Secondary | ICD-10-CM | POA: Diagnosis not present

## 2014-09-24 DIAGNOSIS — N183 Chronic kidney disease, stage 3 unspecified: Secondary | ICD-10-CM

## 2014-09-24 DIAGNOSIS — R05 Cough: Secondary | ICD-10-CM | POA: Diagnosis not present

## 2014-09-24 MED ORDER — AMOXICILLIN-POT CLAVULANATE 875-125 MG PO TABS: 1.0000 | ORAL_TABLET | Freq: Two times a day (BID) | ORAL | Status: DC

## 2014-09-24 MED ORDER — SILDENAFIL CITRATE 50 MG PO TABS: 50.0000 mg | ORAL_TABLET | Freq: Every day | ORAL | Status: DC | PRN

## 2014-09-24 NOTE — Patient Instructions (Addendum)
Augmentin 875 mg take one pill twice daily  X 10 days - take at breakfast and supper with large glass of water.  It would help reduce the usual side effects (diarrhea and yeast infections) if you ate cultured yogurt at lunch.   The key is to stop smoking completely before smoking completely stops you!   While coughing I recommend pepcid (famotidine) 20 mg after bfast and supper   viagra 50 mg one hour before activity as needed   Please schedule a follow up visit in 3 months but call sooner if needed

## 2014-09-24 NOTE — Progress Notes (Signed)
Subjective:     Patient ID: [name] [name], male   DOB: 22-Jun-1950   MRN: [id number]    Brief patient profile:  26  yowm active smoker with IHD/PVD no longer followed in Cardiology since [name] [name] left but still on plavix and asa s/p CABG '97 and stent in 2006         History of Present Illness  06/02/2014 f/u ov/[name] [name] re: hbp/ hyperlipidemia/not sure what meds he's on/ only returned because we wouldn't continue to fill them but should have run out sometime last year.  Chief Complaint  Patient presents with  . Follow-up    Pt here for med refills pt denies sob, chest tightness, wheeze and or cough.  some am cough/ active smoker with am production of a tsp or two mucoid worse in winter Not limited by breathing from desired activities    08/13/2014 f/u ov/[name] [name] re: CRI ? Etiology new since last ov/ cb still smoking  Chief Complaint  Patient presents with  . Annual Exam    Labs done 08/12/14. No co's today.    taking nsaids prn    no problem with urination  Denies sob  rec Prevnar 13 due today  Stop all the over counter medications except tylenol For arthritis /joint pain >>  Sulindac 200 mg twice daily with meals as needed  The key is to stop smoking completely before smoking completely stops you!     09/24/2014 f/u ov/[name] [name] re: CRI/ smoker/ purulent bronchitis  Chief Complaint  Patient presents with  . Follow-up    pt c/o prod cough with green mucus X3 weeks, sinus congestion.  recheck labs today.     no more nsaids/ still smoking / ED / no sob / arthritis ok on sulindac  No obvious day to day or daytime variabilty in couging/ cough worse in am but no   cp or chest tightness, subjective wheeze overt  hb symptoms. No unusual exp hx or h/o childhood pna/ asthma or knowledge of premature birth.  Sleeping ok without nocturnal  or early am exacerbation  of respiratory  c/o's or need for noct saba. Also denies any obvious fluctuation of symptoms with weather or environmental changes or other  aggravating or alleviating factors except as outlined above   Current Medications, Allergies, Complete Past Medical History, Past Surgical History, Family History, and Social History were reviewed in [employer] record.  ROS  The following are not active complaints unless bolded sore throat, dysphagia, dental problems, itching, sneezing,  nasal congestion or excess/ purulent secretions, ear ache,   fever, chills, sweats, unintended wt loss, pleuritic or exertional cp, hemoptysis,  orthopnea pnd or leg swelling, presyncope, palpitations, heartburn, abdominal pain, anorexia, nausea, vomiting, diarrhea  or change in bowel or urinary habits, change in stools or urine, dysuria,hematuria,  rash, arthralgias, visual complaints, headache, numbness weakness or ataxia or problems with walking or coordination,  change in mood/affect or memory.         Past Medical History:  HYPERLIPIDEMIA (ICD-272.4)  CAD (ICD-414.00)  - s/p CABG 1997  - LHC 02/23/2005 > stent of graft to cx, ef 55%  PERIPHERAL VASCULAR DISEASE (ICD-443.9)  - 12/09/04 carotid dopplers < 80% Right, < 60 Left  BRONCHITIS, CHRONIC (ICD-491.9)  HYPERTENSION  - ACE d/c June 25, 2009 (chronic cough)  HYPERLIPIDEMIA (ICD-272.4)  - Target LDL < 70 due to [place]..............................................................Marland KitchenWert  -DT July 24, 2008  -Pneumovax (second shot) July 24, 2008  [id number] 08/13/2014  -  CPX  08/13/2014  -Colon screening rec 04/07/2004 > declined again  09/16/10 p discussion risks vs benefits    Family History:  MI younger brother (x2 y younger) dsf  MI Father @ 23   Social History:  Current smoker. Smokes average of 1/4 ppd.  works sharpening blades         Objective:   Physical Exam   amb wm/ nad/ vital signs reviewed    wt  181 July 24, 2008 > 178 June 25, 2009  >  180 09/17/2010 > 177  05/02/2012 > 06/02/2014   175 > 08/13/2014  173> 09/24/2014    172   HEENT:  upper dentures, nl turbinates, and orophanx. Nl external ear canals without cough reflex  NECK : without JVD/Nodes/TM/ nl carotid upstrokes bilaterally  LUNGS: very slt barrel shaped, no acc muscle use, minimal insp and exp rhonchi bilaterally  CV: RRR no s3 or murmur or increase in P2, no edema  ABD: soft and nontender with nl excursion in the supine position. No bruits or organomegaly, bowel sounds nl  Ext  warm without deformities, calf tenderness, cyanosis or clubbing  SKIN: warm and dry without lesions  NEURO: alert, approp, no deficits  GU R testicle atrophic Rectal mod bph smooth, rubbery, no nodules G neg         Labs ordered/ reviewed:  Lab 09/23/14 0840  NA 136  K 4.4  CL 101  CO2 29  BUN 13  CREATININE 1.05  GLUCOSE 111*       Assessment:

## 2014-09-27 NOTE — Assessment & Plan Note (Signed)
Acute onset/ active smoking > rec  Augmentin 875 mg take one pill twice daily  X 10 days   Also Explained the natural history of uri and why it's necessary in patients at risk to treat GERD aggressively - at least  short term -   to reduce risk of evolving cyclical cough initially  triggered by epithelial injury and a heightened sensitivty to the effects of any upper airway irritants,  most importantly acid - related - then perpetuated by epithelial injury related to the cough itself as the upper airway collapses on itself.  That is, the more sensitive the epithelium becomes once it is damaged by the virus, the more the ensuing irritability> the more the cough, the more the secondary reflux (especially in those prone to reflux) the more the irritation of the sensitive mucosa and so on in a  Classic cyclical pattern.

## 2014-09-27 NOTE — Assessment & Plan Note (Signed)
Try viagra 50 mg daily prn

## 2014-09-27 NOTE — Assessment & Plan Note (Signed)

## 2014-09-27 NOTE — Assessment & Plan Note (Signed)
Lab Results  Component Value Date   CREATININE 1.05 09/23/2014   CREATININE 1.57* 08/12/2014   CREATININE 1.01 06/02/2014    Improved on sulindac, the only nsaid that spares renal protaglandins > no further w/u needed

## 2014-10-26 ENCOUNTER — Telehealth: Payer: Self-pay | Admitting: Internal Medicine

## 2014-10-26 NOTE — Telephone Encounter (Signed)
lmtcb x2 

## 2014-10-26 NOTE — Telephone Encounter (Signed)
Pt wife returning call and can be reached @ [phone].[name] [name]

## 2014-10-26 NOTE — Telephone Encounter (Signed)
ATC Jeannine Kitten NA and VM full WCB

## 2014-10-27 MED ORDER — NIACIN (ANTIHYPERLIPIDEMIC) 500 MG PO TABS: ORAL_TABLET | ORAL | Status: DC

## 2014-10-27 NOTE — Telephone Encounter (Signed)
Called and spoke to pt's wife. Pt is requesting Niacin 500mg  be changed to Niacor 500mg  through mail order. Niacor is less expensive.   Dr. Melvyn Novas please advise.

## 2014-10-27 NOTE — Telephone Encounter (Signed)
Fine with me

## 2014-10-27 NOTE — Telephone Encounter (Signed)
RX has been sent. Nothing further needed

## 2014-10-30 ENCOUNTER — Other Ambulatory Visit: Payer: Self-pay | Admitting: *Deleted

## 2014-10-30 MED ORDER — SIMVASTATIN 20 MG PO TABS: 20.0000 mg | ORAL_TABLET | Freq: Every day | ORAL | Status: DC

## 2015-01-12 ENCOUNTER — Ambulatory Visit (INDEPENDENT_AMBULATORY_CARE_PROVIDER_SITE_OTHER): Payer: Medicare Other | Admitting: Internal Medicine

## 2015-01-12 ENCOUNTER — Other Ambulatory Visit (INDEPENDENT_AMBULATORY_CARE_PROVIDER_SITE_OTHER): Payer: Medicare Other

## 2015-01-12 ENCOUNTER — Encounter: Payer: Self-pay | Admitting: Internal Medicine

## 2015-01-12 ENCOUNTER — Ambulatory Visit (INDEPENDENT_AMBULATORY_CARE_PROVIDER_SITE_OTHER)
Admission: RE | Admit: 2015-01-12 | Discharge: 2015-01-12 | Disposition: A | Discharge status: Home / Self Care | Payer: Medicare Other | Source: Ambulatory Visit | Attending: Internal Medicine | Admitting: Internal Medicine

## 2015-01-12 VITALS — BP 144/80 | HR 71 | Ht 69.0 in | Wt 175.0 lb

## 2015-01-12 DIAGNOSIS — Z23 Encounter for immunization: Secondary | ICD-10-CM | POA: Diagnosis not present

## 2015-01-12 DIAGNOSIS — E785 Hyperlipidemia, unspecified: Secondary | ICD-10-CM

## 2015-01-12 DIAGNOSIS — R05 Cough: Secondary | ICD-10-CM

## 2015-01-12 DIAGNOSIS — N183 Chronic kidney disease, stage 3 unspecified: Secondary | ICD-10-CM

## 2015-01-12 DIAGNOSIS — R0989 Other specified symptoms and signs involving the circulatory and respiratory systems: Secondary | ICD-10-CM

## 2015-01-12 DIAGNOSIS — Z720 Tobacco use: Secondary | ICD-10-CM

## 2015-01-12 DIAGNOSIS — F17210 Nicotine dependence, cigarettes, uncomplicated: Secondary | ICD-10-CM

## 2015-01-12 DIAGNOSIS — I259 Chronic ischemic heart disease, unspecified: Secondary | ICD-10-CM | POA: Diagnosis not present

## 2015-01-12 DIAGNOSIS — N189 Chronic kidney disease, unspecified: Secondary | ICD-10-CM

## 2015-01-12 DIAGNOSIS — R059 Cough, unspecified: Secondary | ICD-10-CM

## 2015-01-12 DIAGNOSIS — N2889 Other specified disorders of kidney and ureter: Secondary | ICD-10-CM

## 2015-01-12 LAB — HEPATIC FUNCTION PANEL
ALBUMIN: 4.3 g/dL (ref 3.5–5.2)
ALT: 13 U/L (ref 0–53)
AST: 21 U/L (ref 0–37)
Alkaline Phosphatase: 53 U/L (ref 39–117)
Bilirubin, Direct: 0.1 mg/dL (ref 0.0–0.3)
TOTAL PROTEIN: 7.7 g/dL (ref 6.0–8.3)
Total Bilirubin: 0.6 mg/dL (ref 0.2–1.2)

## 2015-01-12 LAB — CBC WITH DIFFERENTIAL/PLATELET
BASOS PCT: 0.8 % (ref 0.0–3.0)
Basophils Absolute: 0.1 10*3/uL (ref 0.0–0.1)
EOS ABS: 0.2 10*3/uL (ref 0.0–0.7)
Eosinophils Relative: 2.2 % (ref 0.0–5.0)
HEMATOCRIT: 49.9 % (ref 39.0–52.0)
Hemoglobin: 17.2 g/dL — ABNORMAL HIGH (ref 13.0–17.0)
LYMPHS ABS: 1.8 10*3/uL (ref 0.7–4.0)
Lymphocytes Relative: 24.2 % (ref 12.0–46.0)
MCHC: 34.5 g/dL (ref 30.0–36.0)
MCV: 96.2 fl (ref 78.0–100.0)
Monocytes Absolute: 0.7 10*3/uL (ref 0.1–1.0)
Monocytes Relative: 9.3 % (ref 3.0–12.0)
NEUTROS ABS: 4.6 10*3/uL (ref 1.4–7.7)
Neutrophils Relative %: 63.5 % (ref 43.0–77.0)
PLATELETS: 215 10*3/uL (ref 150.0–400.0)
RBC: 5.18 Mil/uL (ref 4.22–5.81)
RDW: 12.8 % (ref 11.5–15.5)
WBC: 7.3 10*3/uL (ref 4.0–10.5)

## 2015-01-12 LAB — BASIC METABOLIC PANEL
BUN: 11 mg/dL (ref 6–23)
CHLORIDE: 102 meq/L (ref 96–112)
CO2: 31 meq/L (ref 19–32)
CREATININE: 1.07 mg/dL (ref 0.40–1.50)
Calcium: 9.7 mg/dL (ref 8.4–10.5)
GFR: 73.61 mL/min (ref >60.00)
GLUCOSE: 97 mg/dL (ref 70–99)
Potassium: 3.7 mEq/L (ref 3.5–5.1)
Sodium: 139 mEq/L (ref 135–145)

## 2015-01-12 LAB — LIPID PANEL
CHOLESTEROL: 178 mg/dL (ref 0–200)
HDL: 41.8 mg/dL (ref >39.00)
NONHDL: 136.2
TRIGLYCERIDES: 216 mg/dL — AB (ref 0.0–149.0)
Total CHOL/HDL Ratio: 4
VLDL: 43.2 mg/dL — ABNORMAL HIGH (ref 0.0–40.0)

## 2015-01-12 LAB — LDL CHOLESTEROL, DIRECT: Direct LDL: 112 mg/dL

## 2015-01-12 NOTE — Patient Instructions (Addendum)
The key is to stop smoking completely before smoking completely stops you!   I also strongly recommend you have a colonoscopy to prevent you from getting colon cancer through early screening   Try pepcid after bfast and after supper and purchase a pill organizer that has 4 slots per day   GERD (REFLUX)  is an extremely common cause of respiratory symptoms just like yours , many times with no obvious heartburn at all.    It can be treated with medication, but also with lifestyle changes including elevation of the head of your bed (ideally with 6 inch  bed blocks),  Smoking cessation, avoidance of late meals, excessive alcohol, and avoid fatty foods, chocolate, peppermint, colas, red wine, and acidic juices such as orange juice.  NO MINT OR MENTHOL PRODUCTS SO NO COUGH DROPS  USE SUGARLESS CANDY INSTEAD (Jolley ranchers or Stover's or Life Savers) or even ice chips will also do - the key is to swallow to prevent all throat clearing. NO OIL BASED VITAMINS - use powdered substitutes.  Flu shot today   Please remember to go to the lab and xray  department downstairs for your tests - we will call you with the results when they are available.    Please schedule a follow up office visit in 6 months , call sooner if needed  Late add needs pfts on return

## 2015-01-12 NOTE — Progress Notes (Signed)
Subjective:     Patient ID: [name] [name], male   DOB: 05/13/1950   MRN: [id number]    Brief patient profile:  53  yowm active smoker with IHD/PVD no longer followed in Cardiology since Dr Albertine Patricia left but still on plavix and asa s/p CABG '97 and stent in 2006         History of Present Illness  06/02/2014 f/u ov/Wert re: hbp/ hyperlipidemia/not sure what meds he's on/ only returned because we wouldn't continue to fill them but should have run out sometime last year.  Chief Complaint  Patient presents with  . Follow-up    Pt here for med refills pt denies sob, chest tightness, wheeze and or cough.  some am cough/ active smoker with am production of a tsp or two mucoid worse in winter Not limited by breathing from desired activities    08/13/2014 f/u ov/Wert re: CRI ? Etiology new since last ov/ cb still smoking  Chief Complaint  Patient presents with  . Annual Exam    Labs done 08/12/14. No co's today.    taking nsaids prn    no problem with urination  Denies sob  rec Prevnar 13 due today  Stop all the over counter medications except tylenol For arthritis /joint pain >>  Sulindac 200 mg twice daily with meals as needed  The key is to stop smoking completely before smoking completely stops you!     09/24/2014 f/u ov/Wert re: CRI/ smoker/ purulent bronchitis  Chief Complaint  Patient presents with  . Follow-up    pt c/o prod cough with green mucus X3 weeks, sinus congestion.  recheck labs today.     no more nsaids/ still smoking / ED / no sob / arthritis ok on sulindac rec Augmentin 875 mg take one pill twice daily  X 10 days - take at breakfast and supper with large glass of water.  It would help reduce the usual side effects (diarrhea and yeast infections) if you ate cultured yogurt at lunch.  The key is to stop smoking completely before smoking completely stops you!  While coughing I recommend pepcid (famotidine) 20 mg after bfast and supper  viagra 50 mg one hour before activity  as needed    01/12/2015  f/u ov/Wert re: chronic cough/ hyperlipidemia / still smoking  Chief Complaint  Patient presents with  . Follow-up    Pt states cough is some better- still prod with minimal light green sputum.  No new co's.      cough worse in am/ Not limited by breathing from desired activities/ not taking noct h2 as rec    No obvious day to day or daytime variabilty or sob   cp or chest tightness, subjective wheeze overt  hb symptoms. No unusual exp hx or h/o childhood pna/ asthma or knowledge of premature birth.  Sleeping ok without nocturnal  or early am exacerbation  of respiratory  c/o's or need for noct saba. Also denies any obvious fluctuation of symptoms with weather or environmental changes or other aggravating or alleviating factors except as outlined above   Current Medications, Allergies, Complete Past Medical History, Past Surgical History, Family History, and Social History were reviewed in [employer] record.  ROS  The following are not active complaints unless bolded sore throat, dysphagia, dental problems, itching, sneezing,  nasal congestion or excess/ purulent secretions, ear ache,   fever, chills, sweats, unintended wt loss, pleuritic or exertional cp, hemoptysis,  orthopnea pnd or  leg swelling, presyncope, palpitations, heartburn, abdominal pain, anorexia, nausea, vomiting, diarrhea  or change in bowel or urinary habits, change in stools or urine, dysuria,hematuria,  rash, arthralgias, visual complaints, headache, numbness weakness or ataxia or problems with walking or coordination,  change in mood/affect or memory.         Past Medical History:  HYPERLIPIDEMIA (ICD-272.4)  CAD (ICD-414.00)  - s/p CABG 1997  - LHC 02/23/2005 > stent of graft to cx, ef 55%  PERIPHERAL VASCULAR DISEASE (ICD-443.9)  - 12/09/04 carotid dopplers < 80% Right, < 60 Left  BRONCHITIS, CHRONIC (ICD-491.9)  HYPERTENSION  - ACE d/c June 25, 2009 (chronic cough)   HYPERLIPIDEMIA (ICD-272.4)  - Target LDL < 70 due to [place]............................................................... Wert  - DT July 24, 2008  - Pneumovax (second shot) July 24, 2008  Prevnar13 08/13/2014  - CPX  08/13/2014  - Colon screening rec 04/07/2004 > declined again  09/16/10 and again 01/12/2015  p discussion risks vs benefits    Family History:  MI younger brother (x2 y younger) dsf  MI Father @ 75   Social History:  Current smoker. Smokes average of 1/4 ppd.  works sharpening blades         Objective:   Physical Exam   amb wm/ nad/ vital signs reviewed    wt  181 July 24, 2008 > 178 June 25, 2009  >  180 09/17/2010 > 177  05/02/2012 > 06/02/2014   175 > 08/13/2014  173> 09/24/2014    172 > 01/12/2015 175   HEENT: upper dentures, nl turbinates, and orophanx. Nl external ear canals without cough reflex  NECK : without JVD/Nodes/TM/ nl carotid upstrokes bilaterally  LUNGS: very slt barrel shaped, no acc muscle use, minimal insp and exp rhonchi bilaterally  CV: RRR no s3 or murmur or increase in P2, no edema  ABD: soft and nontender with nl excursion in the supine position. No bruits or organomegaly, bowel sounds nl  Ext  warm without deformities, calf tenderness, cyanosis or clubbing  SKIN: warm and dry without lesions  NEURO: alert, approp, no deficits    CXR PA and Lateral:   01/12/2015 :    I personally reviewed images and agree with radiology impression as follows:    Chronic bronchitic changes without acute cardiopulmonary disease. No interval change from prior.  Labs ordered/ reviewed:       Lab 01/12/15 0926  NA 139  K 3.7  CL 102  CO2 31  BUN 11  CREATININE 1.07  GLUCOSE 97       Lab 01/12/15 0926  HGB 17.2*  HCT 49.9  WBC 7.3  PLT 215.0                   Assessment:

## 2015-01-14 NOTE — Progress Notes (Signed)
Quick Note:  Called and spoke with pt. Reviewed results and recs. Pt voiced understanding and had no further questions. ______ 

## 2015-01-17 NOTE — Assessment & Plan Note (Signed)
Lab Results  Component Value Date   CREATININE 1.07 01/12/2015   CREATININE 1.05 09/23/2014   CREATININE 1.57* 08/12/2014     Improved / no change rx

## 2015-01-17 NOTE — Assessment & Plan Note (Signed)
-   Goal LDL < 70 due to CAD   Lab Results  Component Value Date   CHOL 178 01/12/2015   HDL 41.80 01/12/2015   LDLCALC 89 08/12/2014   LDLDIRECT 112.0 01/12/2015   TRIG 216.0* 01/12/2015   CHOLHDL 4 01/12/2015   On zocor 40 with lfts ok/ still smoking > no change in rx for now

## 2015-01-17 NOTE — Assessment & Plan Note (Signed)
>   3 min discussion I reviewed the Fletcher curve with the patient that basically indicates  if you quit smoking when your best day FEV1 is still well preserved (as is probably  the case here)  it is highly unlikely you will progress to severe disease and informed the patient there was no medication on the market that has proven to alter the curve/ its downward trajectory  or the likelihood of progression of their disease.  Therefore stopping smoking and maintaining abstinence is the most important aspect of care, not choice of inhalers or for that matter, doctors.    Would not commit to quit today. Doesn't set of PFTs for baseline purposes as noted he has now developed a classic smoker's cough though not limited with exertion

## 2015-01-17 NOTE — Assessment & Plan Note (Addendum)
This is probably secondary to smoking but could also be due to nocturnal reflux recommended that he take Pepcid in the evening after supper or at bedtime to see what difference if any this makes  I note discussing his care with him and his wife that he tends to make mistakes with medication administration and is relying on her to fill a pillbox but only has one slot per day. I suggested by a pillbox that has 4 per day  and reviewed why this is necessary.  I had an extended discussion with the patient reviewing all relevant studies completed to date and  lasting 15 to 20 minutes of a 25 minute visit    Each maintenance medication was reviewed in detail including most importantly the difference between maintenance and prns and under what circumstances the prns are to be triggered using an action plan format that is not reflected in the computer generated alphabetically organized AVS.    Please see instructions for details which were reviewed in writing and the patient given a copy highlighting the part that I personally wrote and discussed at today's ov.

## 2015-05-17 DIAGNOSIS — J0100 Acute maxillary sinusitis, unspecified: Secondary | ICD-10-CM | POA: Diagnosis not present

## 2015-05-17 DIAGNOSIS — J209 Acute bronchitis, unspecified: Secondary | ICD-10-CM | POA: Diagnosis not present

## 2015-07-29 ENCOUNTER — Telehealth: Payer: Self-pay | Admitting: Internal Medicine

## 2015-07-29 MED ORDER — SIMVASTATIN 20 MG PO TABS: 20.0000 mg | ORAL_TABLET | Freq: Every day | ORAL | Status: DC

## 2015-07-29 MED ORDER — SULINDAC 200 MG PO TABS: ORAL_TABLET | ORAL | Status: DC

## 2015-07-29 MED ORDER — NIACIN (ANTIHYPERLIPIDEMIC) 500 MG PO TABS: ORAL_TABLET | ORAL | Status: DC

## 2015-07-29 NOTE — Telephone Encounter (Signed)
Pt has obtained new Insurance and can now go to local pharmacy at [place]. Pt would like to have Niacor, Zocor, and Sulindac. Rx's have been sent and nothing further needed at this time.

## 2015-10-30 ENCOUNTER — Other Ambulatory Visit: Payer: Self-pay | Admitting: Internal Medicine

## 2015-11-15 ENCOUNTER — Other Ambulatory Visit: Payer: Self-pay | Admitting: Internal Medicine

## 2015-12-27 ENCOUNTER — Other Ambulatory Visit: Payer: Self-pay | Admitting: Internal Medicine

## 2015-12-28 ENCOUNTER — Telehealth: Payer: Self-pay | Admitting: Internal Medicine

## 2015-12-28 DIAGNOSIS — E785 Hyperlipidemia, unspecified: Secondary | ICD-10-CM

## 2015-12-28 DIAGNOSIS — R05 Cough: Secondary | ICD-10-CM

## 2015-12-28 DIAGNOSIS — R059 Cough, unspecified: Secondary | ICD-10-CM

## 2015-12-28 DIAGNOSIS — I10 Essential (primary) hypertension: Secondary | ICD-10-CM

## 2015-12-28 MED ORDER — NIACIN (ANTIHYPERLIPIDEMIC) 500 MG PO TABS: ORAL_TABLET | ORAL | 0 refills | Status: DC

## 2015-12-28 NOTE — Telephone Encounter (Signed)
bmet  Dx hbp Lipid profile lfts tsh dx hyperlipidemia  Cbc with diff  dx cough

## 2015-12-28 NOTE — Telephone Encounter (Signed)
Spoke with pt's wife. Pt is scheduled for a physical with MW on 01/07/16. He would like to have his labs drawn prior to his appointment.  MW - please advise what labs you would like the pt to have. Thanks.

## 2015-12-28 NOTE — Telephone Encounter (Signed)
Spoke with spouse and is aware lab orders placed. Nothing further needed

## 2016-01-04 ENCOUNTER — Other Ambulatory Visit (INDEPENDENT_AMBULATORY_CARE_PROVIDER_SITE_OTHER): Payer: PPO

## 2016-01-04 DIAGNOSIS — R059 Cough, unspecified: Secondary | ICD-10-CM

## 2016-01-04 DIAGNOSIS — R05 Cough: Secondary | ICD-10-CM

## 2016-01-04 DIAGNOSIS — E785 Hyperlipidemia, unspecified: Secondary | ICD-10-CM

## 2016-01-04 DIAGNOSIS — I10 Essential (primary) hypertension: Secondary | ICD-10-CM | POA: Diagnosis not present

## 2016-01-04 LAB — CBC WITH DIFFERENTIAL/PLATELET
BASOS ABS: 0 10*3/uL (ref 0.0–0.1)
Basophils Relative: 0.5 % (ref 0.0–3.0)
EOS PCT: 2.2 % (ref 0.0–5.0)
Eosinophils Absolute: 0.2 10*3/uL (ref 0.0–0.7)
HEMATOCRIT: 46.9 % (ref 39.0–52.0)
Hemoglobin: 16.4 g/dL (ref 13.0–17.0)
LYMPHS PCT: 21.4 % (ref 12.0–46.0)
Lymphs Abs: 1.7 10*3/uL (ref 0.7–4.0)
MCHC: 34.8 g/dL (ref 30.0–36.0)
MCV: 95.5 fl (ref 78.0–100.0)
MONOS PCT: 9.4 % (ref 3.0–12.0)
Monocytes Absolute: 0.7 10*3/uL (ref 0.1–1.0)
NEUTROS ABS: 5.2 10*3/uL (ref 1.4–7.7)
Neutrophils Relative %: 66.5 % (ref 43.0–77.0)
PLATELETS: 219 10*3/uL (ref 150.0–400.0)
RBC: 4.92 Mil/uL (ref 4.22–5.81)
RDW: 13.3 % (ref 11.5–15.5)
WBC: 7.8 10*3/uL (ref 4.0–10.5)

## 2016-01-04 LAB — BASIC METABOLIC PANEL
BUN: 13 mg/dL (ref 6–23)
CALCIUM: 9.1 mg/dL (ref 8.4–10.5)
CO2: 29 meq/L (ref 19–32)
CREATININE: 1.11 mg/dL (ref 0.40–1.50)
Chloride: 106 mEq/L (ref 96–112)
GFR: 70.34 mL/min (ref >60.00)
GLUCOSE: 93 mg/dL (ref 70–99)
Potassium: 4.1 mEq/L (ref 3.5–5.1)
Sodium: 140 mEq/L (ref 135–145)

## 2016-01-04 LAB — LIPID PANEL
CHOL/HDL RATIO: 4
Cholesterol: 137 mg/dL (ref 0–200)
HDL: 35.9 mg/dL — ABNORMAL LOW (ref >39.00)
LDL Cholesterol: 70 mg/dL (ref 0–99)
NONHDL: 101.08
Triglycerides: 157 mg/dL — ABNORMAL HIGH (ref 0.0–149.0)
VLDL: 31.4 mg/dL (ref 0.0–40.0)

## 2016-01-04 LAB — HEPATIC FUNCTION PANEL
ALBUMIN: 4 g/dL (ref 3.5–5.2)
ALK PHOS: 47 U/L (ref 39–117)
ALT: 12 U/L (ref 0–53)
AST: 17 U/L (ref 0–37)
BILIRUBIN DIRECT: 0.1 mg/dL (ref 0.0–0.3)
BILIRUBIN TOTAL: 0.5 mg/dL (ref 0.2–1.2)
Total Protein: 7.3 g/dL (ref 6.0–8.3)

## 2016-01-04 LAB — TSH: TSH: 2.01 u[IU]/mL (ref 0.35–4.50)

## 2016-01-07 ENCOUNTER — Encounter: Payer: Self-pay | Admitting: Internal Medicine

## 2016-01-07 ENCOUNTER — Ambulatory Visit (INDEPENDENT_AMBULATORY_CARE_PROVIDER_SITE_OTHER): Payer: PPO | Admitting: Internal Medicine

## 2016-01-07 VITALS — BP 134/80 | HR 72 | Ht 69.0 in | Wt 182.4 lb

## 2016-01-07 DIAGNOSIS — R05 Cough: Secondary | ICD-10-CM

## 2016-01-07 DIAGNOSIS — I10 Essential (primary) hypertension: Secondary | ICD-10-CM | POA: Diagnosis not present

## 2016-01-07 DIAGNOSIS — N183 Chronic kidney disease, stage 3 unspecified: Secondary | ICD-10-CM

## 2016-01-07 DIAGNOSIS — I259 Chronic ischemic heart disease, unspecified: Secondary | ICD-10-CM

## 2016-01-07 DIAGNOSIS — F17210 Nicotine dependence, cigarettes, uncomplicated: Secondary | ICD-10-CM | POA: Diagnosis not present

## 2016-01-07 DIAGNOSIS — E785 Hyperlipidemia, unspecified: Secondary | ICD-10-CM

## 2016-01-07 DIAGNOSIS — R059 Cough, unspecified: Secondary | ICD-10-CM

## 2016-01-07 DIAGNOSIS — N189 Chronic kidney disease, unspecified: Secondary | ICD-10-CM

## 2016-01-07 DIAGNOSIS — J449 Chronic obstructive pulmonary disease, unspecified: Secondary | ICD-10-CM

## 2016-01-07 NOTE — Progress Notes (Signed)
Subjective:     Patient ID: [name] [name], male   DOB: 1950-08-17   MRN: [id number]    Brief patient profile:  74 yowm active smoker with IHD/PVD no longer followed in Cardiology since Dr [name] [name] on just  asa s/p CABG '97 and stent in 2006  And declined referral back to cards.        History of Present Illness   08/13/2014 f/u ov/[name] [name] re: CRI ? Etiology new since last ov/ cb still smoking  Chief Complaint  Patient presents with  . Annual Exam    Labs done 08/12/14. No co's today.    taking nsaids prn    no problem with urination  Denies sob  rec Prevnar 13 due today  Stop all the over counter medications except tylenol For arthritis /joint pain >>  Sulindac 200 mg twice daily with meals as needed  The key is to stop smoking completely before smoking completely stops you!       01/07/2016  f/u ov/[name] [name] re: hyperlipidemia/ smoker with ascvd and GOLD 0 copd  Chief Complaint  Patient presents with  . Annual Exam    Labs completed 01/04/16. Pt states overall doing well. He has occ cough in the am- prod with minimal clear sputum.   am cough worse in spring  And fall  Never more than a tsp or two mucoid no purulent sputum or mucus plugs   Not limited by breathing from desired activities   yardwork / chopping wood  Ok s cp/ no tia/ claudication   No obvious day to day or daytime variabilty or sob   cp or chest tightness, subjective wheeze overt  hb symptoms. No unusual exp hx or h/o childhood pna/ asthma or knowledge of premature birth.  Sleeping ok without nocturnal  or early am exacerbation  of respiratory  c/o's or need for noct saba. Also denies any obvious fluctuation of symptoms with weather or environmental changes or other aggravating or alleviating factors except as outlined above   Current Medications, Allergies, Complete Past Medical History, Past Surgical History, Family History, and Social History were reviewed in [employer] record.  ROS  The  following are not active complaints unless bolded sore throat, dysphagia, dental problems, itching, sneezing,  nasal congestion or excess/ purulent secretions, ear ache,   fever, chills, sweats, unintended wt loss, pleuritic or exertional cp, hemoptysis,  orthopnea pnd or leg swelling, presyncope, palpitations, heartburn, abdominal pain, anorexia, nausea, vomiting, diarrhea  or change in bowel or urinary habits, change in stools or urine, dysuria,hematuria,  rash, arthralgias, visual complaints, headache, numbness weakness or ataxia or problems with walking or coordination,  change in mood/affect or memory.         Past Medical History:  HYPERLIPIDEMIA (ICD-272.4)  CAD (ICD-414.00)  - s/p CABG 1997  - [facility] 02/23/2005 > stent of graft to cx, ef 55%  PERIPHERAL VASCULAR DISEASE (ICD-443.9)  - 12/09/04 carotid dopplers < 80% Right, < 60 Left  BRONCHITIS, CHRONIC (ICD-491.9)  HYPERTENSION  - ACE d/c June 25, 2009 (chronic cough)  HYPERLIPIDEMIA (ICD-272.4)  - Target LDL < 70 due to [name] [name]............................................................... [name] [name]  - DT July 24, 2008  - Pneumovax (second shot) July 24, 2008  Prevnar13 08/13/2014  - CPX 01/07/2016  - Colon screening rec 04/07/2004 > declined again  09/16/10 and again 01/12/2015  p discussion risks vs benefits    Family History:  MI younger brother (x2 y younger) dsf  MI Father @  72   Social History:  Current smoker. Smokes average of 1/4 ppd.  works sharpening blades         Objective:   Physical Exam   amb wm/ nad/ vital signs reviewed    wt  181 July 24, 2008 > 178 June 25, 2009  >  180 09/17/2010 > 177  05/02/2012 > 06/02/2014   175 > 08/13/2014  173> 09/24/2014    172 > 01/12/2015 175 > 01/07/2016  182    HEENT: upper dentures, nl turbinates, and orophanx. Nl external ear canals without cough reflex  NECK : without JVD/Nodes/TM/ nl carotid upstrokes bilaterally  LUNGS: very slt barrel shaped, no acc muscle  use, minimal insp and exp rhonchi bilaterally  CV: RRR no s3 or murmur or increase in P2, no edema  ABD: soft and nontender with nl excursion in the supine position. 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, nl L , no IH RECTAL: moderate smooth bph no nodules / stool G neg        Labs ordered/ reviewed:      Chemistry      Component Value Date/Time   NA 140 01/04/2016 0730   K 4.1 01/04/2016 0730   CL 106 01/04/2016 0730   CO2 29 01/04/2016 0730   BUN 13 01/04/2016 0730   CREATININE 1.11 01/04/2016 0730      Component Value Date/Time   CALCIUM 9.1 01/04/2016 0730   ALKPHOS 47 01/04/2016 0730   AST 17 01/04/2016 0730   ALT 12 01/04/2016 0730   BILITOT 0.5 01/04/2016 0730        Lab Results  Component Value Date   WBC 7.8 01/04/2016   HGB 16.4 01/04/2016   HCT 46.9 01/04/2016   MCV 95.5 01/04/2016   PLT 219.0 01/04/2016         Lab Results  Component Value Date   TSH 2.01 01/04/2016            Lab Results  Component Value Date   CHOL 137 01/04/2016   HDL 35.90 (L) 01/04/2016   LDLCALC 70 01/04/2016   LDLDIRECT 112.0 01/12/2015   TRIG 157.0 (H) 01/04/2016   CHOLHDL 4 01/04/2016     EKG 01/07/2016  SR with non-specific IVCD no ischemic changes   Assessment:

## 2016-01-07 NOTE — Patient Instructions (Addendum)
The key is to stop smoking completely before smoking completely stops you!   No change in medications  I strongly rec you reconsider colonoscopy and call me when ready to schedule  Please schedule a follow up visit in 6  months but call sooner if needed

## 2016-01-08 ENCOUNTER — Encounter: Payer: Self-pay | Admitting: Internal Medicine

## 2016-01-08 NOTE — Assessment & Plan Note (Signed)
Lab Results  Component Value Date   CREATININE 1.11 01/04/2016   CREATININE 1.07 01/12/2015   CREATININE 1.05 09/23/2014     Presently Estimated Creatinine Clearance: 65.5 mL/min (by C-G formula based on SCr of 1.11 mg/dL). so only stage I   rec avoid nsaids other than sulindac

## 2016-01-08 NOTE — Assessment & Plan Note (Signed)
-   Goal LDL < 70 due to CAD   Lab Results  Component Value Date   CHOL 137 01/04/2016   HDL 35.90 (L) 01/04/2016   LDLCALC 70 01/04/2016   LDLDIRECT 112.0 01/12/2015   TRIG 157.0 (H) 01/04/2016   CHOLHDL 4 01/04/2016     lfts ok on zocor 20 mg daily > no change rx needed

## 2016-01-08 NOTE — Assessment & Plan Note (Signed)
Previously followed in [facility] - s/p CABG 1997  - [place] 02/23/2005 > stent of graft to cx, ef 55%    -Referred  back 09/17/2010 > no evidence that he ever went   No symptoms of cp/tia/ claudication, main risk factors are smoking/ hyperlipdemia  F/u cards prn

## 2016-01-08 NOTE — Assessment & Plan Note (Signed)
>   3 min Discussed the risks and costs (both direct and indirect)  of smoking relative to the benefits of quitting but patient unwilling to commit at this point to a specific quit date.    Although I don't endorse regular use of e cigs/ many pts find them helpful; however, I emphasized they should be considered a "one-way bridge" off all tobacco products.  

## 2016-01-08 NOTE — Assessment & Plan Note (Signed)
Spirometry 01/07/2016  FEV1 1.81 (55%)  Ratio 82 with mild curvature and CB features off all rx    Since main symptom is am cough no rx indicated other than stop smoking

## 2016-01-27 ENCOUNTER — Other Ambulatory Visit: Payer: Self-pay | Admitting: Internal Medicine

## 2016-02-15 ENCOUNTER — Other Ambulatory Visit: Payer: Self-pay | Admitting: Pulmonary Disease

## 2016-05-17 ENCOUNTER — Other Ambulatory Visit: Payer: Self-pay | Admitting: Internal Medicine

## 2016-06-05 ENCOUNTER — Other Ambulatory Visit: Payer: Self-pay

## 2016-06-05 MED ORDER — NIACIN (ANTIHYPERLIPIDEMIC) 500 MG PO TABS: ORAL_TABLET | ORAL | 11 refills | Status: DC

## 2016-07-07 ENCOUNTER — Ambulatory Visit: Payer: PPO | Admitting: Internal Medicine

## 2016-08-14 ENCOUNTER — Telehealth: Payer: Self-pay | Admitting: Internal Medicine

## 2016-08-14 NOTE — Telephone Encounter (Signed)
Generic niacin is all that he needs and if can't make the transition with his pharmacists help (it's over the counter) then ok to leave off completely and we can regroup in office and address it then but should come in fasting for this ov for recheck in about a month when he's on niacin or nothing at that point

## 2016-08-14 NOTE — Telephone Encounter (Signed)
Patient wife called - she received notification that [name] [name] is not longer being manufactured and pt wants to know what alternative is going to be used -  Please advise thanks

## 2016-08-14 NOTE — Telephone Encounter (Signed)
I called and spoke with the pt's spouse and notified of recs per MW and she verbalized understanding  Nothing further needed

## 2016-09-04 ENCOUNTER — Telehealth: Payer: Self-pay | Admitting: Internal Medicine

## 2016-09-04 MED ORDER — NIACIN ER 500 MG PO CPCR: 500.0000 mg | ORAL_CAPSULE | Freq: Every day | ORAL | 11 refills | Status: DC

## 2016-09-04 NOTE — Telephone Encounter (Signed)
Per the 4.30.18 phone note: Tanda Rockers, MD 08/14/16 2:53 PM   Generic niacin is all that he needs and if can't make the transition with his pharmacists help (it's over the counter) then ok to leave off completely and we can regroup in office and address it then but should come in fasting for this ov for recheck in about a month when he's on niacin or nothing at that point       Rosana Berger, [facility] 08/14/16 2:35 PM   Patient wife called - she received notification that Dewaine Oats is not longer being manufactured and pt wants to know what alternative is going to be used -   Please advise thanks      Called spoke with patient's spouse Harmon Pier She is requesting an Rx for generic Niacor 500mg  QD >> Niacin Rx sent to verified pharmacy with same number of refills as the original rx Nothing further needed; will sign off

## 2016-09-27 ENCOUNTER — Other Ambulatory Visit: Payer: Self-pay | Admitting: Internal Medicine

## 2016-12-05 ENCOUNTER — Ambulatory Visit (INDEPENDENT_AMBULATORY_CARE_PROVIDER_SITE_OTHER): Payer: PPO | Admitting: Internal Medicine

## 2016-12-05 ENCOUNTER — Encounter: Payer: Self-pay | Admitting: Internal Medicine

## 2016-12-05 VITALS — BP 142/80 | HR 65 | Ht 69.0 in | Wt 176.0 lb

## 2016-12-05 DIAGNOSIS — Z0000 Encounter for general adult medical examination without abnormal findings: Secondary | ICD-10-CM | POA: Diagnosis not present

## 2016-12-05 DIAGNOSIS — J449 Chronic obstructive pulmonary disease, unspecified: Secondary | ICD-10-CM | POA: Diagnosis not present

## 2016-12-05 DIAGNOSIS — E785 Hyperlipidemia, unspecified: Secondary | ICD-10-CM

## 2016-12-05 DIAGNOSIS — N183 Chronic kidney disease, stage 3 unspecified: Secondary | ICD-10-CM

## 2016-12-05 DIAGNOSIS — F17210 Nicotine dependence, cigarettes, uncomplicated: Secondary | ICD-10-CM

## 2016-12-05 MED ORDER — SIMVASTATIN 20 MG PO TABS: 20.0000 mg | ORAL_TABLET | Freq: Every day | ORAL | 0 refills | Status: DC

## 2016-12-05 NOTE — Progress Notes (Signed)
Subjective:     Patient ID: [name] [name], male   DOB: Oct 19, 1950   MRN: [id number]    Brief patient profile:  10 yowm active smoker with IHD/PVD no longer followed in Cardiology since Dr Laurine Blazer on just  asa s/p CABG '97 and stent in 2006  And declined referral back to cards.        History of Present Illness   08/13/2014 f/u ov/[name] [name] re: CRI ? Etiology new since last ov/ cb still smoking  Chief Complaint  Patient presents with  . Annual Exam    Labs done 08/12/14. No co's today.    taking nsaids prn    no problem with urination  Denies sob  rec Prevnar 13 due today  Stop all the over counter medications except tylenol For arthritis /joint pain >>  Sulindac 200 mg twice daily with meals as needed  The key is to stop smoking completely before smoking completely stops you!       01/07/2016  f/u ov/[name] [name] re: hyperlipidemia/ smoker with ascvd and GOLD 0 copd  Chief Complaint  Patient presents with  . Annual Exam    Labs completed 01/04/16. Pt states overall doing well. He has occ cough in the am- prod with minimal clear sputum.   am cough worse in spring  And fall  Never more than a tsp or two mucoid no purulent sputum or mucus plugs   Not limited by breathing from desired activities   yardwork / chopping wood  Ok s cp/ no tia/ claudication  rec The key is to stop smoking completely before smoking completely stops you!  No change in medications I strongly rec you reconsider colonoscopy and call me when ready to schedule> declined     12/05/2016  f/u ov/[name] [name] re: still smoking on niacin 500 mg after supper and zocor 20 p supper  And pepcid/asa  Chief Complaint  Patient presents with  . Follow-up    Doing well and no co's today.   Not limited by breathing from desired activities  / no tia/ claudication  Min am cough /congestion clears < 5 min / sputum mucoid  No obvious day to day or daytime variability or assoc excess/ purulent sputum or mucus plugs or hemoptysis or cp or  chest tightness, subjective wheeze or overt sinus or hb symptoms. No unusual exp hx or h/o childhood pna/ asthma or knowledge of premature birth.  Sleeping ok without nocturnal  or early am exacerbation  of respiratory  c/o's or need for noct saba. Also denies any obvious fluctuation of symptoms with weather or environmental changes or other aggravating or alleviating factors except as outlined above   Current Medications, Allergies, Complete Past Medical History, Past Surgical History, Family History, and Social History were reviewed in [employer] record.  ROS  The following are not active complaints unless bolded sore throat, dysphagia, dental problems, itching, sneezing,  nasal congestion or excess/ purulent secretions, ear ache,   fever, chills, sweats, unintended wt loss, classically pleuritic or exertional cp,  orthopnea pnd or leg swelling, presyncope, palpitations, abdominal pain, anorexia, nausea, vomiting, diarrhea  or change in bowel or bladder habits, change in stools or urine, dysuria,hematuria,  rash, arthralgias, visual complaints, headache, numbness, weakness or ataxia or problems with walking or coordination,  change in mood/affect or memory.                    Past Medical History:  HYPERLIPIDEMIA (ICD-272.4)  CAD (ICD-414.00)  -  s/p CABG 1997  - LHC 02/23/2005 > stent of graft to cx, ef 55%  PERIPHERAL VASCULAR DISEASE (ICD-443.9)  - 12/09/04 carotid dopplers < 80% Right, < 60 Left  BRONCHITIS, CHRONIC (ICD-491.9)  HYPERTENSION  - ACE d/c June 25, 2009 (chronic cough)  HYPERLIPIDEMIA (ICD-272.4)  - Target LDL < 70 due to [place]............................................................... Taunya Goral  - DT July 24, 2008  - Pneumovax (second shot) July 24, 2008  Prevnar13 08/13/2014  - CPX 01/07/2016  - Colon screening rec 04/07/2004 > declined again  09/16/10 and again 01/12/2015  p discussion risks vs benefits    Family History:   MI younger brother (x2 y younger) dsf  MI Father @ 40   Social History:  Current smoker. Smokes average of 1/4 ppd.  works sharpening blades         Objective:   Physical Exam   amb wm/ nad/ vital signs reviewed    wt  181 July 24, 2008 > 178 June 25, 2009  >  180 09/17/2010 > 177  05/02/2012 > 06/02/2014   175 > 08/13/2014  173> 09/24/2014    172 > 01/12/2015 175 > 01/07/2016  182 > 12/05/2016  177    HEENT: upper dentures, nl turbinates, and orophanx. Nl external ear canals without cough reflex  NECK : without JVD/Nodes/TM/ nl carotid upstrokes bilaterally  LUNGS: very slt barrel shaped, no acc muscle use, minimal insp and exp rhonchi bilaterally  CV: RRR no s3 or murmur or increase in P2, no edema  ABD: soft and nontender with nl excursion in the supine position. No bruits or organomegaly, bowel sounds nl  Ext  warm without deformities, calf tenderness, cyanosis or clubbing  SKIN: warm and dry without lesions  NEURO: alert, approp, no deficits                Assessment:

## 2016-12-05 NOTE — Patient Instructions (Signed)
Set up follow up with [facility] and see me as needed for pulmonary / respiratory problems

## 2016-12-06 DIAGNOSIS — Z0000 Encounter for general adult medical examination without abnormal findings: Secondary | ICD-10-CM | POA: Insufficient documentation

## 2016-12-06 NOTE — Assessment & Plan Note (Addendum)
Spirometry 01/07/2016  FEV1 1.81 (55%)  Ratio 82 with mild curvature and CB features off all rx      I reviewed the Fletcher curve with the patient that basically indicates  if you quit smoking when your best day FEV1 is still well preserved (as is still  the case here)  it is highly unlikely you will progress to severe disease and informed the patient there was  no medication on the market that has proven to alter the curve/ its downward trajectory  or the likelihood of progression of their disease(unlike other chronic medical conditions such as atheroclerosis where we do think we can change the natural hx with risk reducing meds)    Therefore stopping smoking and maintaining abstinence is the most important aspect of care, not choice of inhalers or for that matter, doctors.    No indication for rx other than prn mucinex or mucinex dm

## 2016-12-06 NOTE — Assessment & Plan Note (Signed)
>   3 min Not committed to quit at this point

## 2016-12-06 NOTE — Assessment & Plan Note (Signed)
Lab Results  Component Value Date   CREATININE 1.11 01/04/2016   CREATININE 1.07 01/12/2015   CREATININE 1.05 09/23/2014     Avoiding nsaids other than prn clinoril

## 2016-12-06 NOTE — Assessment & Plan Note (Signed)
-   Goal LDL < 70 due to CAD  Lab Results  Component Value Date   CHOL 137 01/04/2016   HDL 35.90 (L) 01/04/2016   LDLCALC 70 01/04/2016   LDLDIRECT 112.0 01/12/2015   TRIG 157.0 (H) 01/04/2016   CHOLHDL 4 01/04/2016    Due for recheck / cpx > referred to [facility] to establish

## 2017-05-03 ENCOUNTER — Ambulatory Visit (INDEPENDENT_AMBULATORY_CARE_PROVIDER_SITE_OTHER): Payer: PPO | Admitting: Internal Medicine

## 2017-05-03 ENCOUNTER — Other Ambulatory Visit (INDEPENDENT_AMBULATORY_CARE_PROVIDER_SITE_OTHER): Payer: PPO

## 2017-05-03 ENCOUNTER — Encounter: Payer: Self-pay | Admitting: Internal Medicine

## 2017-05-03 VITALS — BP 106/72 | HR 74 | Temp 98.1°F | Ht 69.0 in | Wt 173.0 lb

## 2017-05-03 DIAGNOSIS — Z0000 Encounter for general adult medical examination without abnormal findings: Secondary | ICD-10-CM

## 2017-05-03 DIAGNOSIS — J449 Chronic obstructive pulmonary disease, unspecified: Secondary | ICD-10-CM | POA: Diagnosis not present

## 2017-05-03 DIAGNOSIS — R739 Hyperglycemia, unspecified: Secondary | ICD-10-CM

## 2017-05-03 LAB — BASIC METABOLIC PANEL
BUN: 12 mg/dL (ref 6–23)
CO2: 29 mEq/L (ref 19–32)
Calcium: 9.6 mg/dL (ref 8.4–10.5)
Chloride: 103 mEq/L (ref 96–112)
Creatinine, Ser: 1 mg/dL (ref 0.40–1.50)
GFR: 79.03 mL/min (ref >60.00)
GLUCOSE: 88 mg/dL (ref 70–99)
POTASSIUM: 4.1 meq/L (ref 3.5–5.1)
SODIUM: 139 meq/L (ref 135–145)

## 2017-05-03 LAB — CBC WITH DIFFERENTIAL/PLATELET
Basophils Absolute: 0.1 10*3/uL (ref 0.0–0.1)
Basophils Relative: 0.7 % (ref 0.0–3.0)
EOS PCT: 1.8 % (ref 0.0–5.0)
Eosinophils Absolute: 0.1 10*3/uL (ref 0.0–0.7)
HCT: 48.5 % (ref 39.0–52.0)
HEMOGLOBIN: 16.1 g/dL (ref 13.0–17.0)
LYMPHS ABS: 2 10*3/uL (ref 0.7–4.0)
Lymphocytes Relative: 27.1 % (ref 12.0–46.0)
MCHC: 33.2 g/dL (ref 30.0–36.0)
MCV: 99.7 fl (ref 78.0–100.0)
MONO ABS: 0.6 10*3/uL (ref 0.1–1.0)
Monocytes Relative: 8.3 % (ref 3.0–12.0)
Neutro Abs: 4.7 10*3/uL (ref 1.4–7.7)
Neutrophils Relative %: 62.1 % (ref 43.0–77.0)
Platelets: 232 10*3/uL (ref 150.0–400.0)
RBC: 4.86 Mil/uL (ref 4.22–5.81)
RDW: 13.4 % (ref 11.5–15.5)
WBC: 7.5 10*3/uL (ref 4.0–10.5)

## 2017-05-03 LAB — URINALYSIS, ROUTINE W REFLEX MICROSCOPIC
BILIRUBIN URINE: NEGATIVE
Hgb urine dipstick: NEGATIVE
KETONES UR: NEGATIVE
LEUKOCYTES UA: NEGATIVE
Nitrite: NEGATIVE
SPECIFIC GRAVITY, URINE: 1.01 (ref 1.000–1.030)
Total Protein, Urine: NEGATIVE
URINE GLUCOSE: NEGATIVE
UROBILINOGEN UA: 0.2 (ref 0.0–1.0)
WBC, UA: NONE SEEN — AB (ref 0–?)
pH: 6.5 (ref 5.0–8.0)

## 2017-05-03 LAB — HEPATIC FUNCTION PANEL
ALT: 11 U/L (ref 0–53)
AST: 20 U/L (ref 0–37)
Albumin: 4.5 g/dL (ref 3.5–5.2)
Alkaline Phosphatase: 49 U/L (ref 39–117)
BILIRUBIN DIRECT: 0.1 mg/dL (ref 0.0–0.3)
BILIRUBIN TOTAL: 0.4 mg/dL (ref 0.2–1.2)
Total Protein: 7.2 g/dL (ref 6.0–8.3)

## 2017-05-03 LAB — TSH: TSH: 1.93 u[IU]/mL (ref 0.35–4.50)

## 2017-05-03 LAB — PSA: PSA: 1.21 ng/mL (ref 0.10–4.00)

## 2017-05-03 LAB — LIPID PANEL
Cholesterol: 149 mg/dL (ref 0–200)
HDL: 39.9 mg/dL (ref >39.00)
NONHDL: 108.74
Total CHOL/HDL Ratio: 4
Triglycerides: 207 mg/dL — ABNORMAL HIGH (ref 0.0–149.0)
VLDL: 41.4 mg/dL — AB (ref 0.0–40.0)

## 2017-05-03 LAB — HEMOGLOBIN A1C: HEMOGLOBIN A1C: 5.8 % (ref 4.6–6.5)

## 2017-05-03 LAB — LDL CHOLESTEROL, DIRECT: Direct LDL: 91 mg/dL

## 2017-05-03 NOTE — Progress Notes (Signed)
Subjective:    Patient ID: [name] [name], male    DOB: [birth year], 67 y.o.   MRN: [id number]  HPI Here for wellness and f/u;  Overall doing ok;  Pt denies Chest pain, worsening SOB, DOE, wheezing, orthopnea, PND, worsening LE edema, palpitations, dizziness or syncope.  Pt denies neurological change such as new headache, facial or extremity weakness.  Pt denies polydipsia, polyuria, or low sugar symptoms. Pt states overall good compliance with treatment and medications, good tolerability, and has been trying to follow appropriate diet.  Pt denies worsening depressive symptoms, suicidal ideation or panic. No fever, night sweats, wt loss, loss of appetite, or other constitutional symptoms.  Pt states good ability with ADL's, has low fall risk, home safety reviewed and adequate, no other significant changes in hearing or vision, and only occasionally active with exercise.  Still smoking, has tried chantix before, did not help.    Peak wt has been 243 lbs but intentionally lost wt. n No other new complaints or interval hx Past Medical History:  Diagnosis Date  . Bronchitis, chronic ([place])   . CAD (coronary artery disease)   . HTN (hypertension)   . Hyperlipidemia   . PVD (peripheral vascular disease) ([place])    Past Surgical History:  Procedure Laterality Date  . CARDIAC CATHETERIZATION  02/23/2005  . coronary arteriography L heart cath    . CORONARY ARTERY BYPASS GRAFT  6/97   x 5    reports that he has been smoking cigarettes.  He has a 40.00 pack-year smoking history. he has never used smokeless tobacco. He reports that he does not drink alcohol or use drugs. family history includes Heart attack in his brother; Heart attack (age of onset: 18) in his father. No Known Allergies Current Outpatient Medications on File Prior to Visit  Medication Sig Dispense Refill  . aspirin 81 MG tablet Take 81 mg by mouth daily.      . B Complex-C (SUPER B COMPLEX) TABS Take 1 tablet by mouth daily.      .  famotidine (PEPCID) 20 MG tablet Take 20 mg by mouth daily.    . niacin 500 MG CR capsule Take 1 capsule (500 mg total) by mouth at bedtime. 30 capsule 11  . simvastatin (ZOCOR) 20 MG tablet Take 1 tablet (20 mg total) by mouth daily. 90 tablet 0  . sulindac (CLINORIL) 200 MG tablet FOR JOINT PAIN TAKE UP TO TWICE DAILY AS NEEDED WITH MEALS 60 tablet 0   No current facility-administered medications on file prior to visit.    Review of Systems Constitutional: Negative for other unusual diaphoresis, sweats, appetite or weight changes HENT: Negative for other worsening hearing loss, ear pain, facial swelling, mouth sores or neck stiffness.   Eyes: Negative for other worsening pain, redness or other visual disturbance.  Respiratory: Negative for other stridor or swelling Cardiovascular: Negative for other palpitations or other chest pain  Gastrointestinal: Negative for worsening diarrhea or loose stools, blood in stool, distention or other pain Genitourinary: Negative for hematuria, flank pain or other change in urine volume.  Musculoskeletal: Negative for myalgias or other joint swelling.  Skin: Negative for other color change, or other wound or worsening drainage.  Neurological: Negative for other syncope or numbness. Hematological: Negative for other adenopathy or swelling Psychiatric/Behavioral: Negative for hallucinations, other worsening agitation, SI, self-injury, or new decreased concentration All other system neg per pt    Objective:   Physical Exam BP 106/72   Pulse 74  Temp 98.1 F (36.7 C) (Oral)   Ht 5\' 9"  (1.753 m)   Wt 173 lb (78.5 kg)   SpO2 98%   BMI 25.55 kg/m  VS noted,  Constitutional: Pt is oriented to person, place, and time. Appears well-developed and well-nourished, in no significant distress and comfortable Head: Normocephalic and atraumatic  Eyes: Conjunctivae and EOM are normal. Pupils are equal, round, and reactive to light Right Ear: External ear normal  without discharge Left Ear: External ear normal without discharge Nose: Nose without discharge or deformity Mouth/Throat: Oropharynx is without other ulcerations and moist  Neck: Normal range of motion. Neck supple. No JVD present. No tracheal deviation present or significant neck LA or mass Cardiovascular: Normal rate, regular rhythm, normal heart sounds and intact distal pulses.   Pulmonary/Chest: WOB normal and breath sounds without rales or wheezing  Abdominal: Soft. Bowel sounds are normal. NT. No HSM  Musculoskeletal: Normal range of motion. Exhibits no edema Lymphadenopathy: Has no other cervical adenopathy.  Neurological: Pt is alert and oriented to person, place, and time. Pt has normal reflexes. No cranial nerve deficit. Motor grossly intact, Gait intact Skin: Skin is warm and dry. No rash noted or new ulcerations Psychiatric:  Has normal mood and affect. Behavior is normal without agitation No other exam findings     Assessment & Plan:

## 2017-05-03 NOTE — Patient Instructions (Addendum)
You had the Pneumovax pneumonia shot today  Please continue all other medications as before, and refills have been done if requested.  Please have the pharmacy call with any other refills you may need.  Please continue your efforts at being more active, low cholesterol diet, and weight control.  You are otherwise up to date with prevention measures today.  Please keep your appointments with your specialists as you may have planned  Please go to the LAB in the Basement (turn left off the elevator) for the tests to be done today  You will be contacted by phone if any changes need to be made immediately.  Otherwise, you will receive a letter about your results with an explanation, but please check with MyChart first.  Please remember to sign up for MyChart if you have not done so, as this will be important to you in the future with finding out test results, communicating by private email, and scheduling acute appointments online when needed.  Please return in 6 months, or sooner if needed 

## 2017-05-05 NOTE — Assessment & Plan Note (Signed)

## 2017-05-05 NOTE — Assessment & Plan Note (Signed)
stable overall by history and exam, and pt to continue medical treatment as before,  to f/u any worsening symptoms or concerns 

## 2017-05-05 NOTE — Assessment & Plan Note (Signed)
Lab Results  Component Value Date   HGBA1C 5.8 05/03/2017  stable overall by history and exam, recent data reviewed with pt, and pt to continue medical treatment as before,  to f/u any worsening symptoms or concerns

## 2017-06-27 DIAGNOSIS — D3132 Benign neoplasm of left choroid: Secondary | ICD-10-CM | POA: Diagnosis not present

## 2017-09-07 ENCOUNTER — Other Ambulatory Visit: Payer: Self-pay | Admitting: Internal Medicine

## 2017-09-10 ENCOUNTER — Other Ambulatory Visit: Payer: Self-pay | Admitting: Internal Medicine

## 2017-09-17 ENCOUNTER — Other Ambulatory Visit: Payer: Self-pay

## 2017-09-17 ENCOUNTER — Telehealth: Payer: Self-pay | Admitting: Internal Medicine

## 2017-09-17 MED ORDER — SIMVASTATIN 20 MG PO TABS: 20.0000 mg | ORAL_TABLET | Freq: Every day | ORAL | 2 refills | Status: DC

## 2017-09-17 NOTE — Telephone Encounter (Signed)
Called and spoke with patients wife, she states that when she called Dr. Jenny Reichmann, patients PCP for the refill of Zocor he stated that Dr. Melvyn Novas needed to. Per Daneil Dan call Dr. Judi Cong office to see what is going on.   Called and spoke with Shirron, Dr. Judi Cong nurse, she stated that since he was seen in January of this year by Dr. Jenny Reichmann they can send in a 90 day supply.  Called and advised patients wife of this. Nothing further is needed.

## 2017-10-22 ENCOUNTER — Other Ambulatory Visit: Payer: Self-pay | Admitting: Internal Medicine

## 2017-10-25 ENCOUNTER — Telehealth: Payer: Self-pay | Admitting: Internal Medicine

## 2017-10-25 ENCOUNTER — Other Ambulatory Visit: Payer: Self-pay | Admitting: Internal Medicine

## 2017-10-25 NOTE — Telephone Encounter (Signed)
Copied from [place] [id number]. Topic: Quick Communication - Rx Refill/Question >> Oct 25, 2017  4:44 PM Bea Graff, NT wrote: Medication: niacin 500 MG CR capsule   Has the patient contacted their pharmacy? Yes.   (Agent: If no, request that the patient contact the pharmacy for the refill.) (Agent: If yes, when and what did the pharmacy advise?)  Preferred Pharmacy (with phone number or street name): [id number] [name], [place]. [phone] (Phone) [phone] (Fax)      Agent: Please be advised that RX refills may take up to 3 business days. We ask that you follow-up with your pharmacy.

## 2017-10-25 NOTE — Telephone Encounter (Signed)
Copied from [place] [id number]. Topic: Quick Communication - Rx Refill/Question >> Oct 25, 2017  4:44 PM Bea Graff, NT wrote: Medication: niacin 500 MG CR capsule   Has the patient contacted their pharmacy? Yes.   (Agent: If no, request that the patient contact the pharmacy for the refill.) (Agent: If yes, when and what did the pharmacy advise?)  Preferred Pharmacy (with phone number or street name): [id number] [name], [place]. [phone] (Phone) [phone] (Fax)      Agent: Please be advised that RX refills may take up to 3 business days. We ask that you follow-up with your pharmacy.

## 2017-10-25 NOTE — Telephone Encounter (Signed)
Request refill on Niacin; last refill 09/04/16; #30; RF x 11 (prescribed by Dr. Melvyn Novas)  Last office visit 05/03/17  PCP Dr. Jenny Reichmann  Phone call to pt.; spoke with pt's wife.  She stated Dr. Melvyn Novas will not refill the the Niacin, and is requesting Dr. Jenny Reichmann to manage this medication.

## 2017-10-26 MED ORDER — NIACIN ER 500 MG PO CPCR: 500.0000 mg | ORAL_CAPSULE | Freq: Every day | ORAL | 5 refills | Status: DC

## 2017-10-26 NOTE — Telephone Encounter (Signed)
Reviewed chart pt is up-to-date sent refills to pof.../lmb  

## 2017-10-27 ENCOUNTER — Other Ambulatory Visit: Payer: Self-pay | Admitting: Internal Medicine

## 2017-10-29 MED ORDER — NIACIN ER (ANTIHYPERLIPIDEMIC) 500 MG PO TBCR: 500.0000 mg | EXTENDED_RELEASE_TABLET | Freq: Every day | ORAL | 5 refills | Status: DC

## 2017-10-29 NOTE — Addendum Note (Signed)
Addended by: Cresenciano Lick on: 10/29/2017 09:46 AM   Modules accepted: Orders

## 2017-10-29 NOTE — Telephone Encounter (Signed)
Pt's wife requesting Niacin tablets be sent. Capsules were sent on 10/25/17. Done. See meds.

## 2017-11-02 ENCOUNTER — Ambulatory Visit: Payer: PPO | Admitting: Internal Medicine

## 2018-02-06 DIAGNOSIS — H2513 Age-related nuclear cataract, bilateral: Secondary | ICD-10-CM | POA: Diagnosis not present

## 2018-02-06 DIAGNOSIS — D3132 Benign neoplasm of left choroid: Secondary | ICD-10-CM | POA: Diagnosis not present

## 2018-04-06 ENCOUNTER — Ambulatory Visit: Payer: PPO

## 2018-04-06 ENCOUNTER — Ambulatory Visit
Admission: EM | Admit: 2018-04-06 | Discharge: 2018-04-06 | Disposition: A | Discharge status: Home / Self Care | Payer: PPO | Attending: Physician Assistant | Admitting: Physician Assistant

## 2018-04-06 ENCOUNTER — Other Ambulatory Visit: Payer: Self-pay

## 2018-04-06 ENCOUNTER — Telehealth: Payer: Self-pay | Admitting: Physician Assistant

## 2018-04-06 ENCOUNTER — Encounter: Payer: Self-pay | Admitting: Emergency Medicine

## 2018-04-06 DIAGNOSIS — R0789 Other chest pain: Secondary | ICD-10-CM | POA: Diagnosis not present

## 2018-04-06 DIAGNOSIS — R079 Chest pain, unspecified: Secondary | ICD-10-CM | POA: Diagnosis not present

## 2018-04-06 DIAGNOSIS — Z951 Presence of aortocoronary bypass graft: Secondary | ICD-10-CM | POA: Diagnosis not present

## 2018-04-06 DIAGNOSIS — J449 Chronic obstructive pulmonary disease, unspecified: Secondary | ICD-10-CM | POA: Insufficient documentation

## 2018-04-06 DIAGNOSIS — N183 Chronic kidney disease, stage 3 (moderate): Secondary | ICD-10-CM | POA: Diagnosis not present

## 2018-04-06 DIAGNOSIS — N529 Male erectile dysfunction, unspecified: Secondary | ICD-10-CM | POA: Diagnosis not present

## 2018-04-06 DIAGNOSIS — I2510 Atherosclerotic heart disease of native coronary artery without angina pectoris: Secondary | ICD-10-CM | POA: Insufficient documentation

## 2018-04-06 DIAGNOSIS — I739 Peripheral vascular disease, unspecified: Secondary | ICD-10-CM | POA: Diagnosis not present

## 2018-04-06 DIAGNOSIS — F17210 Nicotine dependence, cigarettes, uncomplicated: Secondary | ICD-10-CM | POA: Diagnosis not present

## 2018-04-06 DIAGNOSIS — Z79899 Other long term (current) drug therapy: Secondary | ICD-10-CM | POA: Diagnosis not present

## 2018-04-06 DIAGNOSIS — W1830XA Fall on same level, unspecified, initial encounter: Secondary | ICD-10-CM | POA: Diagnosis not present

## 2018-04-06 DIAGNOSIS — E785 Hyperlipidemia, unspecified: Secondary | ICD-10-CM | POA: Insufficient documentation

## 2018-04-06 DIAGNOSIS — Z7982 Long term (current) use of aspirin: Secondary | ICD-10-CM | POA: Insufficient documentation

## 2018-04-06 DIAGNOSIS — Z8249 Family history of ischemic heart disease and other diseases of the circulatory system: Secondary | ICD-10-CM | POA: Diagnosis not present

## 2018-04-06 DIAGNOSIS — I129 Hypertensive chronic kidney disease with stage 1 through stage 4 chronic kidney disease, or unspecified chronic kidney disease: Secondary | ICD-10-CM | POA: Diagnosis not present

## 2018-04-06 MED ORDER — TRAMADOL HCL 50 MG PO TABS: 50.0000 mg | ORAL_TABLET | Freq: Four times a day (QID) | ORAL | 0 refills | Status: DC | PRN

## 2018-04-06 MED ORDER — DICLOFENAC SODIUM 1 % TD GEL: 2.0000 g | Freq: Four times a day (QID) | TRANSDERMAL | 0 refills | Status: DC

## 2018-04-06 NOTE — Telephone Encounter (Signed)
[facility] Will print prescription for patient to fill.

## 2018-04-06 NOTE — Discharge Instructions (Signed)
Xray negative for rib fracture. Continue tylenol for pain. Tramadol for breakthrough pain. You can also use voltaren to help. Use incentive spirometer to help make sure your lungs are filling adequately and to prevent pneumonia. If experiencing sudden shortness of breath, chest pain, fever, go to the emergency department for further evaluation needed.

## 2018-04-06 NOTE — ED Triage Notes (Signed)
Per  Pt he fell on Sunday and is having worsening chest discomfort in the right side. Pt stated he is coughing and having green sputum. Pt stated is not not having SOB OR CHEST PAIN. Sore in his right arm and hard to raise arm over head.

## 2018-04-06 NOTE — ED Provider Notes (Signed)
[facility]    CSN: [id number] Arrival date & time: 04/06/18  1044     History   Chief Complaint Chief Complaint  Patient presents with  . Fall    HPI [name] [name] is a 67 y.o. male.   67 year old male with history of COPD, CAD, HTN, HLD comes in for evaluation after fall 6 days ago.  States he tripped and fell, and fell to the right side of the body with arm outstretched.  Hit right side of face without loss of consciousness.  Was able to ambulate on own after incident.  Had soreness to the neck, upper back, right chest.  States neck pain and back pain has resolved, but chest pain seems to have worsened, particularly with arm movement with internal rotation.  Denies numbness, tingling, loss of grip strength.  Denies headache, blurry vision, weakness, dizziness, syncope.  Denies confusion/altered mental status.  Denies shortness of breath. However, has been trying to take deep breaths due to pain.  States started having cough 3 days ago now with production.  Denies fever, chills, night sweats.  Has been taking Tylenol, BC powder with some relief.  Took wife's muscle relaxant yesterday with mild relief.       Past Medical History:  Diagnosis Date  . Bronchitis, chronic ([place])   . CAD (coronary artery disease)   . HTN (hypertension)   . Hyperlipidemia   . PVD (peripheral vascular disease) [facility])     Patient Active Problem List   Diagnosis Date Noted  . Hyperglycemia 05/03/2017  . Health care maintenance 12/06/2016  . Chronic renal insufficiency, stage III (moderate) ([place]) 08/15/2014  . COPD  GOLD 0  06/02/2014  . Cigarette smoker 06/25/2009  . ERECTILE DYSFUNCTION 07/24/2008  . HYPERTENSION, BENIGN 07/24/2008  . Hyperlipidemia LDL goal <70 03/07/2007  . Chronic ischemic heart disease 03/07/2007  . PERIPHERAL VASCULAR DISEASE 03/07/2007    Past Surgical History:  Procedure Laterality Date  . CARDIAC CATHETERIZATION  02/23/2005  . coronary arteriography L  heart cath    . CORONARY ARTERY BYPASS GRAFT  6/97   x 5       Home Medications    Prior to Admission medications   Medication Sig Start Date End Date Taking? Authorizing Provider  aspirin 81 MG tablet Take 81 mg by mouth daily.      [provider]  B Complex-C (SUPER B COMPLEX) TABS Take 1 tablet by mouth daily.      [provider]  diclofenac sodium (VOLTAREN) 1 % GEL Apply 2 g topically 4 (four) times daily. 04/06/18   Tasia Catchings, Freeman Borba V, PA-C  famotidine (PEPCID) 20 MG tablet Take 20 mg by mouth daily.    [provider]  niacin (NIASPAN) 500 MG CR tablet Take 1 tablet (500 mg total) by mouth daily. 10/29/17   Biagio Borg, MD  simvastatin (ZOCOR) 20 MG tablet Take 1 tablet (20 mg total) by mouth daily. 09/17/17   Biagio Borg, MD  sulindac (CLINORIL) 200 MG tablet FOR JOINT PAIN TAKE UP TO TWICE DAILY AS NEEDED WITH MEALS 09/27/16   Tanda Rockers, MD  traMADol (ULTRAM) 50 MG tablet Take 1 tablet (50 mg total) by mouth every 6 (six) hours as needed. 04/06/18   Ok Edwards, PA-C    Family History Family History  Problem Relation Age of Onset  . Heart attack Brother   . Heart attack Father 48  . Cancer Neg Hx  Social History Social History   Tobacco Use  . Smoking status: Current Every Day Smoker    Packs/day: 1.00    Years: 40.00    Pack years: 40.00    Types: Cigarettes  . Smokeless tobacco: Never Used  Substance Use Topics  . Alcohol use: No    Alcohol/week: 0.0 standard drinks  . Drug use: No     Allergies   Patient has no known allergies.   Review of Systems Review of Systems  Reason unable to perform ROS: See HPI as above.     Physical Exam Triage Vital Signs ED Triage Vitals  Enc Vitals Group     BP 04/06/18 1056 (!) 180/98     Pulse Rate 04/06/18 1056 67     Resp --      Temp 04/06/18 1056 97.6 F (36.4 C)     Temp Source 04/06/18 1056 Oral     SpO2 04/06/18 1056 95 %     Weight 04/06/18 1057 173 lb (78.5 kg)      Height 04/06/18 1057 5\' 9"  (1.753 m)     Head Circumference --      Peak Flow --      Pain Score 04/06/18 1056 0     Pain Loc --      Pain Edu? --      Excl. in [place]? --    No data found.  Updated Vital Signs BP (!) 180/98 (BP Location: Right Arm)   Pulse 67   Temp 97.6 F (36.4 C) (Oral)   Ht 5\' 9"  (1.753 m)   Wt 173 lb (78.5 kg)   SpO2 95%   BMI 25.55 kg/m   Physical Exam Constitutional:      General: He is not in acute distress.    Appearance: He is well-developed. He is not ill-appearing, toxic-appearing or diaphoretic.  HENT:     Head: Normocephalic and atraumatic.  Eyes:     Extraocular Movements: Extraocular movements intact.     Conjunctiva/sclera: Conjunctivae normal.     Pupils: Pupils are equal, round, and reactive to light.     Comments: Bruising to the right cheek, directly inferior to the eye. No swelling. No obvious tenderness to palpation.   Neck:     Musculoskeletal: Normal range of motion and neck supple. No spinous process tenderness or muscular tenderness.  Cardiovascular:     Rate and Rhythm: Normal rate and regular rhythm.     Heart sounds: No murmur. No friction rub. No gallop.   Pulmonary:     Effort: Pulmonary effort is normal. No accessory muscle usage, prolonged expiration, respiratory distress or retractions.     Comments: No swelling, contusion to the chest.  No flail chest.  Audible wheezing with mouth breathing.  Lungs clear to auscultation bilaterally without adventitious lung sounds.  Adequate air movement. Chest:       Comments: Tenderness to palpation along lateral right chest.  No obvious deformity felt.  No crepitus. Musculoskeletal:     Comments: No tenderness to palpation of spinous processes.  No tenderness to palpation of thoracic back, shoulders.  Full range of motion of shoulders, though right chest pain with internal rotation of shoulder.  Strength deferred of shoulders.  Elbow strength, grip strength normal and equal bilaterally.   Sensation intact and equal bilaterally.  Radial pulse 2+, cap refill less than 2 seconds.  Neurological:     Mental Status: He is alert and oriented to person, place, and time.  GCS: GCS eye subscore is 4. GCS verbal subscore is 5. GCS motor subscore is 6.     Coordination: Coordination is intact.     Gait: Gait is intact.      UC Treatments / Results  Labs (all labs ordered are listed, but only abnormal results are displayed) Labs Reviewed - No data to display  EKG None  Radiology Dg Ribs Unilateral W/chest Right  Result Date: 04/06/2018 CLINICAL DATA:  Anterior RIGHT chest wall pain EXAM: RIGHT RIBS AND CHEST - 3+ VIEW COMPARISON:  01/12/2015 FINDINGS: Overexposed film. None Sternotomy wires overlie normal cardiac silhouette. Normal pulmonary vasculature. No effusion, infiltrate, or pneumothorax. No acute osseous abnormality. Dedicated views of the RIGHT ribs demonstrate no displaced fracture. IMPRESSION: 1.  No acute cardiopulmonary process. 2. No evidence of rib fracture.  Overexposed film. Electronically Signed   By: Suzy Bouchard M.D.   On: 04/06/2018 12:39    Procedures Procedures (including critical care time)  Medications Ordered in UC Medications - No data to display  Initial Impression / Assessment and Plan / UC Course  I have reviewed the triage vital signs and the nursing notes.  Pertinent labs & imaging results that were available during my care of the patient were reviewed by me and considered in my medical decision making (see chart for details).  Clinical Course as of Apr 06 1252  Sat Apr 06, 2018  1228 DG Ribs Unilateral W/Chest Right [BH]    Clinical Course User Index [BH] Vanessa Kick, MD   Discussed case with Dr Mannie Stabile, who pulled up imaging with Vanetta Shawl at [facility].  Patient without tachycardia, tachypnea, hypoxia.  Speaking in full sentences without difficulty.  Lungs clear to auscultation bilaterally without adventitious  lung sounds or decrease in air movement.  Low suspicion for pneumothorax.  Will treat for chest wall pain with Tylenol and tramadol.  Patient with history of decrease in renal function on ibuprofen, and was told to avoid.  Other symptomatic treatment discussed.  Use of incentive spirometer discussed.  Return precautions given.  Patient expresses understanding and agrees to plan.  Final Clinical Impressions(s) / UC Diagnoses   Final diagnoses:  Right-sided chest wall pain    ED Prescriptions    Medication Sig Dispense Auth. Provider   traMADol (ULTRAM) 50 MG tablet Take 1 tablet (50 mg total) by mouth every 6 (six) hours as needed. 15 tablet Rorie Delmore V, PA-C   diclofenac sodium (VOLTAREN) 1 % GEL Apply 2 g topically 4 (four) times daily. 1 Tube Ok Edwards, PA-C     Controlled Substance Prescriptions [place] Controlled Substance Registry consulted? Yes, I have consulted the [place] Controlled Substances Registry for this patient, and feel the risk/benefit ratio today is favorable for proceeding with this prescription for a controlled substance.   Ok Edwards, PA-C 04/06/18 1253

## 2018-06-24 ENCOUNTER — Other Ambulatory Visit: Payer: Self-pay | Admitting: Internal Medicine

## 2018-11-04 ENCOUNTER — Other Ambulatory Visit: Payer: Self-pay | Admitting: Internal Medicine

## 2018-11-13 ENCOUNTER — Other Ambulatory Visit: Payer: Self-pay

## 2018-11-14 ENCOUNTER — Encounter: Payer: Self-pay | Admitting: Internal Medicine

## 2018-11-14 ENCOUNTER — Other Ambulatory Visit (INDEPENDENT_AMBULATORY_CARE_PROVIDER_SITE_OTHER): Payer: PPO

## 2018-11-14 ENCOUNTER — Ambulatory Visit (INDEPENDENT_AMBULATORY_CARE_PROVIDER_SITE_OTHER): Payer: PPO | Admitting: Internal Medicine

## 2018-11-14 ENCOUNTER — Other Ambulatory Visit: Payer: Self-pay

## 2018-11-14 VITALS — BP 122/84 | HR 65 | Temp 98.0°F | Ht 69.0 in | Wt 169.0 lb

## 2018-11-14 DIAGNOSIS — Z1159 Encounter for screening for other viral diseases: Secondary | ICD-10-CM | POA: Diagnosis not present

## 2018-11-14 DIAGNOSIS — E559 Vitamin D deficiency, unspecified: Secondary | ICD-10-CM

## 2018-11-14 DIAGNOSIS — E538 Deficiency of other specified B group vitamins: Secondary | ICD-10-CM | POA: Diagnosis not present

## 2018-11-14 DIAGNOSIS — R739 Hyperglycemia, unspecified: Secondary | ICD-10-CM | POA: Diagnosis not present

## 2018-11-14 DIAGNOSIS — Z0000 Encounter for general adult medical examination without abnormal findings: Secondary | ICD-10-CM | POA: Diagnosis not present

## 2018-11-14 DIAGNOSIS — N183 Chronic kidney disease, stage 3 unspecified: Secondary | ICD-10-CM

## 2018-11-14 DIAGNOSIS — E611 Iron deficiency: Secondary | ICD-10-CM | POA: Diagnosis not present

## 2018-11-14 DIAGNOSIS — Z23 Encounter for immunization: Secondary | ICD-10-CM

## 2018-11-14 LAB — CBC WITH DIFFERENTIAL/PLATELET
Basophils Absolute: 0.1 10*3/uL (ref 0.0–0.1)
Basophils Relative: 0.9 % (ref 0.0–3.0)
Eosinophils Absolute: 0.2 10*3/uL (ref 0.0–0.7)
Eosinophils Relative: 2 % (ref 0.0–5.0)
HCT: 47.5 % (ref 39.0–52.0)
Hemoglobin: 16.2 g/dL (ref 13.0–17.0)
Lymphocytes Relative: 24.1 % (ref 12.0–46.0)
Lymphs Abs: 1.8 10*3/uL (ref 0.7–4.0)
MCHC: 34.2 g/dL (ref 30.0–36.0)
MCV: 97.8 fl (ref 78.0–100.0)
Monocytes Absolute: 0.6 10*3/uL (ref 0.1–1.0)
Monocytes Relative: 8.6 % (ref 3.0–12.0)
Neutro Abs: 4.9 10*3/uL (ref 1.4–7.7)
Neutrophils Relative %: 64.4 % (ref 43.0–77.0)
Platelets: 215 10*3/uL (ref 150.0–400.0)
RBC: 4.86 Mil/uL (ref 4.22–5.81)
RDW: 13.4 % (ref 11.5–15.5)
WBC: 7.5 10*3/uL (ref 4.0–10.5)

## 2018-11-14 LAB — URINALYSIS, ROUTINE W REFLEX MICROSCOPIC
Bilirubin Urine: NEGATIVE
Hgb urine dipstick: NEGATIVE
Ketones, ur: NEGATIVE
Leukocytes,Ua: NEGATIVE
Nitrite: NEGATIVE
RBC / HPF: NONE SEEN (ref 0–?)
Specific Gravity, Urine: <=1.005 — AB (ref 1.000–1.030)
Total Protein, Urine: NEGATIVE
Urine Glucose: NEGATIVE
Urobilinogen, UA: 0.2 (ref 0.0–1.0)
pH: 7 (ref 5.0–8.0)

## 2018-11-14 LAB — BASIC METABOLIC PANEL
BUN: 10 mg/dL (ref 6–23)
CO2: 28 mEq/L (ref 19–32)
Calcium: 9.8 mg/dL (ref 8.4–10.5)
Chloride: 103 mEq/L (ref 96–112)
Creatinine, Ser: 0.91 mg/dL (ref 0.40–1.50)
GFR: 82.53 mL/min (ref >60.00)
Glucose, Bld: 94 mg/dL (ref 70–99)
Potassium: 4.3 mEq/L (ref 3.5–5.1)
Sodium: 139 mEq/L (ref 135–145)

## 2018-11-14 LAB — HEPATIC FUNCTION PANEL
ALT: 11 U/L (ref 0–53)
AST: 19 U/L (ref 0–37)
Albumin: 4.6 g/dL (ref 3.5–5.2)
Alkaline Phosphatase: 58 U/L (ref 39–117)
Bilirubin, Direct: 0.1 mg/dL (ref 0.0–0.3)
Total Bilirubin: 0.7 mg/dL (ref 0.2–1.2)
Total Protein: 7.4 g/dL (ref 6.0–8.3)

## 2018-11-14 LAB — TSH: TSH: 1.97 u[IU]/mL (ref 0.35–4.50)

## 2018-11-14 LAB — IBC PANEL
Iron: 92 ug/dL (ref 42–165)
Saturation Ratios: 25.3 % (ref 20.0–50.0)
Transferrin: 260 mg/dL (ref 212.0–360.0)

## 2018-11-14 LAB — VITAMIN D 25 HYDROXY (VIT D DEFICIENCY, FRACTURES): VITD: 60.19 ng/mL (ref 30.00–100.00)

## 2018-11-14 LAB — HEMOGLOBIN A1C: Hgb A1c MFr Bld: 5.8 % (ref 4.6–6.5)

## 2018-11-14 LAB — LIPID PANEL
Cholesterol: 136 mg/dL (ref 0–200)
HDL: 41.2 mg/dL (ref >39.00)
LDL Cholesterol: 62 mg/dL (ref 0–99)
NonHDL: 94.74
Total CHOL/HDL Ratio: 3
Triglycerides: 166 mg/dL — ABNORMAL HIGH (ref 0.0–149.0)
VLDL: 33.2 mg/dL (ref 0.0–40.0)

## 2018-11-14 LAB — PSA: PSA: 0.95 ng/mL (ref 0.10–4.00)

## 2018-11-14 LAB — VITAMIN B12: Vitamin B-12: 617 pg/mL (ref 211–911)

## 2018-11-14 NOTE — Assessment & Plan Note (Signed)
stable overall by history and exam, recent data reviewed with pt, and pt to continue medical treatment as before,  to f/u any worsening symptoms or concerns  

## 2018-11-14 NOTE — Progress Notes (Signed)
Subjective:    Patient ID: [name] [name], male    DOB: 04/05/1951, 68 y.o.   MRN: [id number]  HPI  Here for wellness and f/u;  Overall doing ok;  Pt denies Chest pain, worsening SOB, DOE, wheezing, orthopnea, PND, worsening LE edema, palpitations, dizziness or syncope.  Pt denies neurological change such as new headache, facial or extremity weakness.  Pt denies polydipsia, polyuria, or low sugar symptoms. Pt states overall good compliance with treatment and medications, good tolerability, and has been trying to follow appropriate diet.  Pt denies worsening depressive symptoms, suicidal ideation or panic. No fever, night sweats, wt loss, loss of appetite, or other constitutional symptoms.  Pt states good ability with ADL's, has low fall risk, home safety reviewed and adequate, no other significant changes in hearing or vision, and only occasionally active with exercise. No other new complaints Past Medical History:  Diagnosis Date  . Bronchitis, chronic ([place])   . CAD (coronary artery disease)   . HTN (hypertension)   . Hyperlipidemia   . PVD (peripheral vascular disease) ([place])    Past Surgical History:  Procedure Laterality Date  . CARDIAC CATHETERIZATION  02/23/2005  . coronary arteriography L heart cath    . CORONARY ARTERY BYPASS GRAFT  6/97   x 5    reports that he has been smoking cigarettes. He has a 40.00 pack-year smoking history. He has never used smokeless tobacco. He reports that he does not drink alcohol or use drugs. family history includes Heart attack in his brother; Heart attack (age of onset: 52) in his father. No Known Allergies Current Outpatient Medications on File Prior to Visit  Medication Sig Dispense Refill  . aspirin 81 MG tablet Take 81 mg by mouth daily.      . B Complex-C (SUPER B COMPLEX) TABS Take 1 tablet by mouth daily.      . diclofenac sodium (VOLTAREN) 1 % GEL Apply 2 g topically 4 (four) times daily. 1 Tube 0  . famotidine (PEPCID) 20 MG tablet Take 20  mg by mouth daily.    . niacin (NIASPAN) 500 MG CR tablet Take 1 tablet (500 mg total) by mouth daily. 30 tablet 5  . simvastatin (ZOCOR) 20 MG tablet TAKE 1 TABLET BY MOUTH EVERY DAY 90 tablet 2  . sulindac (CLINORIL) 200 MG tablet FOR JOINT PAIN TAKE UP TO TWICE DAILY AS NEEDED WITH MEALS 60 tablet 0  . traMADol (ULTRAM) 50 MG tablet Take 1 tablet (50 mg total) by mouth every 6 (six) hours as needed. 15 tablet 0   No current facility-administered medications on file prior to visit.    Review of Systems Constitutional: Negative for other unusual diaphoresis, sweats, appetite or weight changes HENT: Negative for other worsening hearing loss, ear pain, facial swelling, mouth sores or neck stiffness.   Eyes: Negative for other worsening pain, redness or other visual disturbance.  Respiratory: Negative for other stridor or swelling Cardiovascular: Negative for other palpitations or other chest pain  Gastrointestinal: Negative for worsening diarrhea or loose stools, blood in stool, distention or other pain Genitourinary: Negative for hematuria, flank pain or other change in urine volume.  Musculoskeletal: Negative for myalgias or other joint swelling.  Skin: Negative for other color change, or other wound or worsening drainage.  Neurological: Negative for other syncope or numbness. Hematological: Negative for other adenopathy or swelling Psychiatric/Behavioral: Negative for hallucinations, other worsening agitation, SI, self-injury, or new decreased concentration All other system neg per pt  Objective:   Physical Exam BP 122/84   Pulse 65   Temp 98 F (36.7 C) (Oral)   Ht 5\' 9"  (1.753 m)   Wt 169 lb (76.7 kg)   SpO2 96%   BMI 24.96 kg/m  VS noted,  Constitutional: Pt is oriented to person, place, and time. Appears well-developed and well-nourished, in no significant distress and comfortable Head: Normocephalic and atraumatic  Eyes: Conjunctivae and EOM are normal. Pupils are equal,  round, and reactive to light Right Ear: External ear normal without discharge Left Ear: External ear normal without discharge Nose: Nose without discharge or deformity Mouth/Throat: Oropharynx is without other ulcerations and moist  Neck: Normal range of motion. Neck supple. No JVD present. No tracheal deviation present or significant neck LA or mass Cardiovascular: Normal rate, regular rhythm, normal heart sounds and intact distal pulses.   Pulmonary/Chest: WOB normal and breath sounds without rales or wheezing  Abdominal: Soft. Bowel sounds are normal. NT. No HSM  Musculoskeletal: Normal range of motion. Exhibits no edema Lymphadenopathy: Has no other cervical adenopathy.  Neurological: Pt is alert and oriented to person, place, and time. Pt has normal reflexes. No cranial nerve deficit. Motor grossly intact, Gait intact Skin: Skin is warm and dry. No rash noted or new ulcerations Psychiatric:  Has normal mood and affect. Behavior is normal without agitation No other exam findings Lab Results  Component Value Date   WBC 7.5 11/14/2018   HGB 16.2 11/14/2018   HCT 47.5 11/14/2018   PLT 215.0 11/14/2018   GLUCOSE 94 11/14/2018   CHOL 136 11/14/2018   TRIG 166.0 (H) 11/14/2018   HDL 41.20 11/14/2018   LDLDIRECT 91.0 05/03/2017   LDLCALC 62 11/14/2018   ALT 11 11/14/2018   AST 19 11/14/2018   NA 139 11/14/2018   K 4.3 11/14/2018   CL 103 11/14/2018   CREATININE 0.91 11/14/2018   BUN 10 11/14/2018   CO2 28 11/14/2018   TSH 1.97 11/14/2018   PSA 0.95 11/14/2018   HGBA1C 5.8 11/14/2018       Assessment & Plan:

## 2018-11-14 NOTE — Assessment & Plan Note (Signed)

## 2018-11-14 NOTE — Patient Instructions (Addendum)
You had the Tdap tetanus and Pneumovax pneumonia shots today  Please quit smoking  Please continue all other medications as before, and refills have been done if requested.  Please have the pharmacy call with any other refills you may need.  Please continue your efforts at being more active, low cholesterol diet, and weight control.  You are otherwise up to date with prevention measures today.  Please keep your appointments with your specialists as you may have planned  Please go to the LAB in the Basement (turn left off the elevator) for the tests to be done today  You will be contacted by phone if any changes need to be made immediately.  Otherwise, you will receive a letter about your results with an explanation, but please check with MyChart first.  Please remember to sign up for MyChart if you have not done so, as this will be important to you in the future with finding out test results, communicating by private email, and scheduling acute appointments online when needed.  Please return in 1 year for your yearly visit, or sooner if needed, with Lab testing done 3-5 days before

## 2018-11-15 LAB — HEPATITIS C ANTIBODY
Hepatitis C Ab: NONREACTIVE
SIGNAL TO CUT-OFF: 0.03

## 2018-12-07 ENCOUNTER — Other Ambulatory Visit: Payer: Self-pay | Admitting: Internal Medicine

## 2018-12-13 DIAGNOSIS — H5203 Hypermetropia, bilateral: Secondary | ICD-10-CM | POA: Diagnosis not present

## 2018-12-13 DIAGNOSIS — H2513 Age-related nuclear cataract, bilateral: Secondary | ICD-10-CM | POA: Diagnosis not present

## 2018-12-13 DIAGNOSIS — D3132 Benign neoplasm of left choroid: Secondary | ICD-10-CM | POA: Diagnosis not present

## 2018-12-26 IMAGING — DX DG RIBS W/ CHEST 3+V*R*
3 series · 3 of 3 positions shown · non-contrast
Comparison: 01/12/2015

CLINICAL DATA: Anterior RIGHT chest wall pain

EXAM:
RIGHT RIBS AND CHEST - 3+ VIEW

[chest pa (1 of 3)]
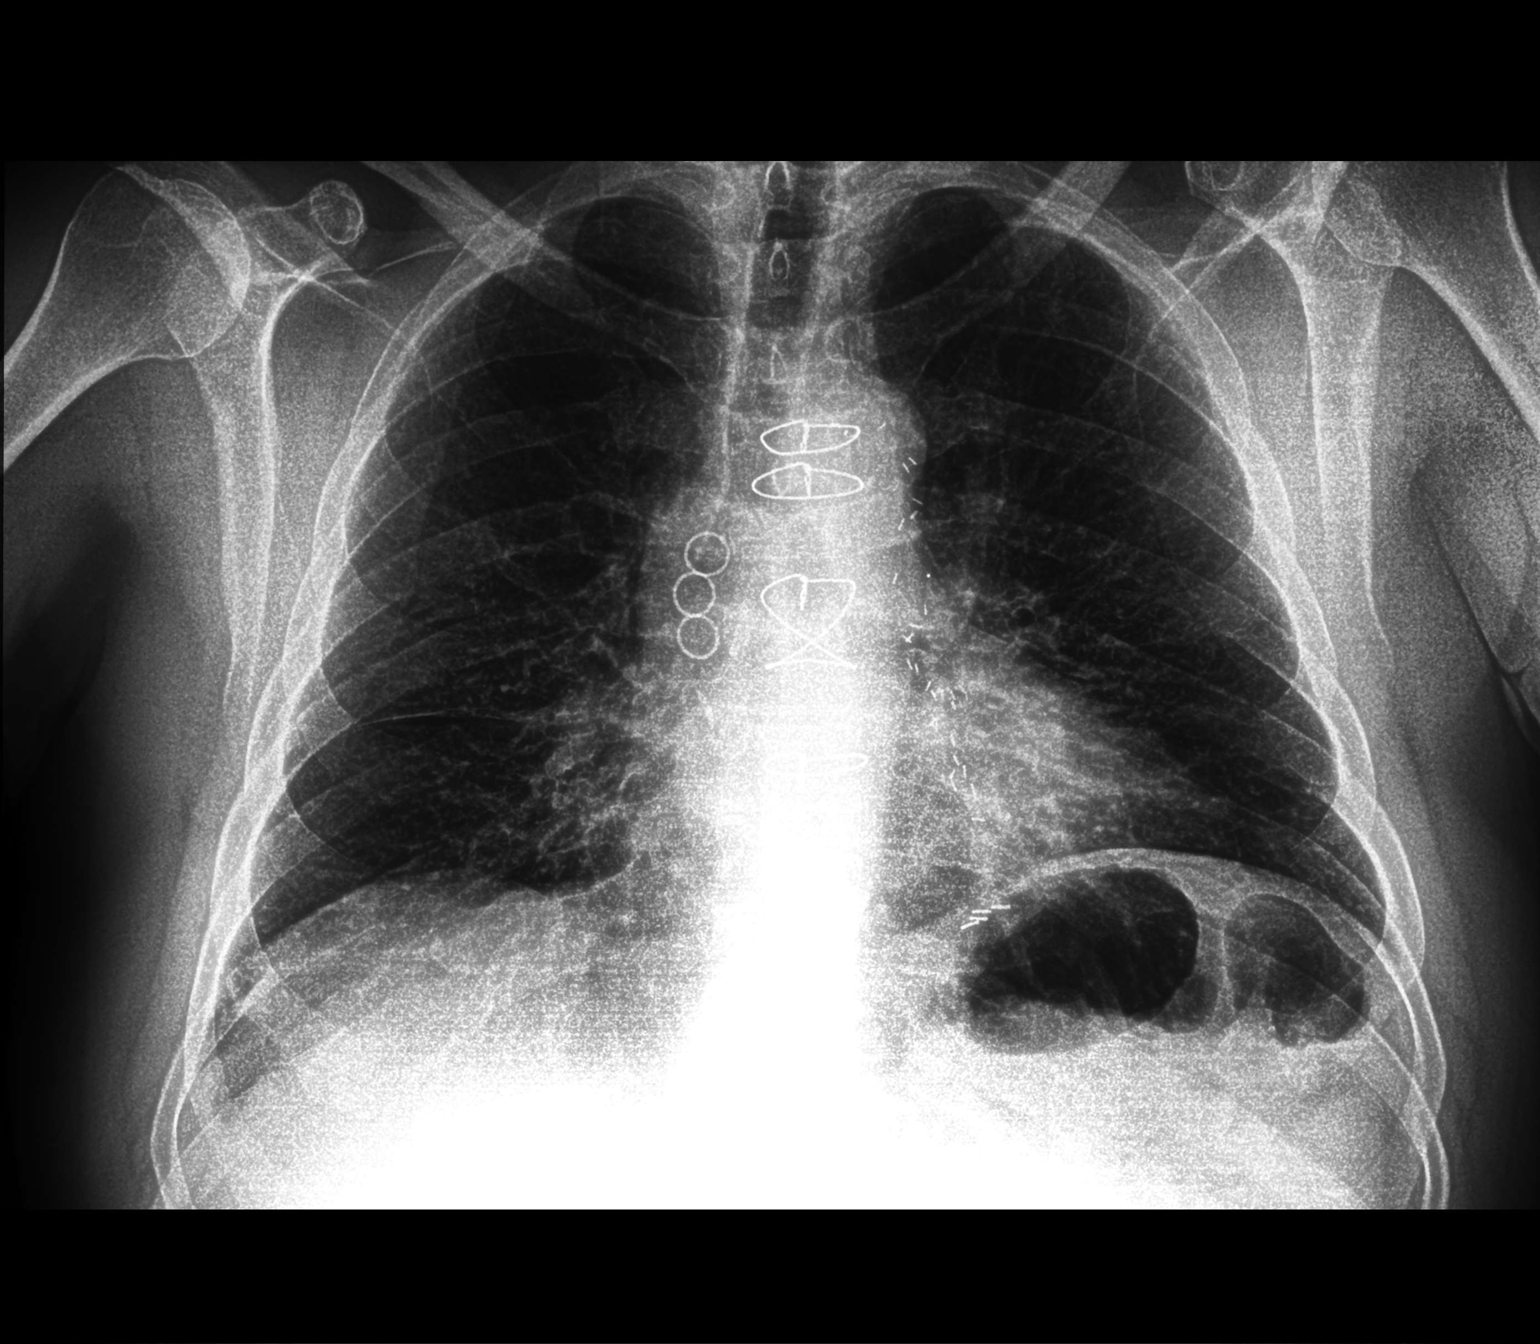

[chest pa (2 of 3)]
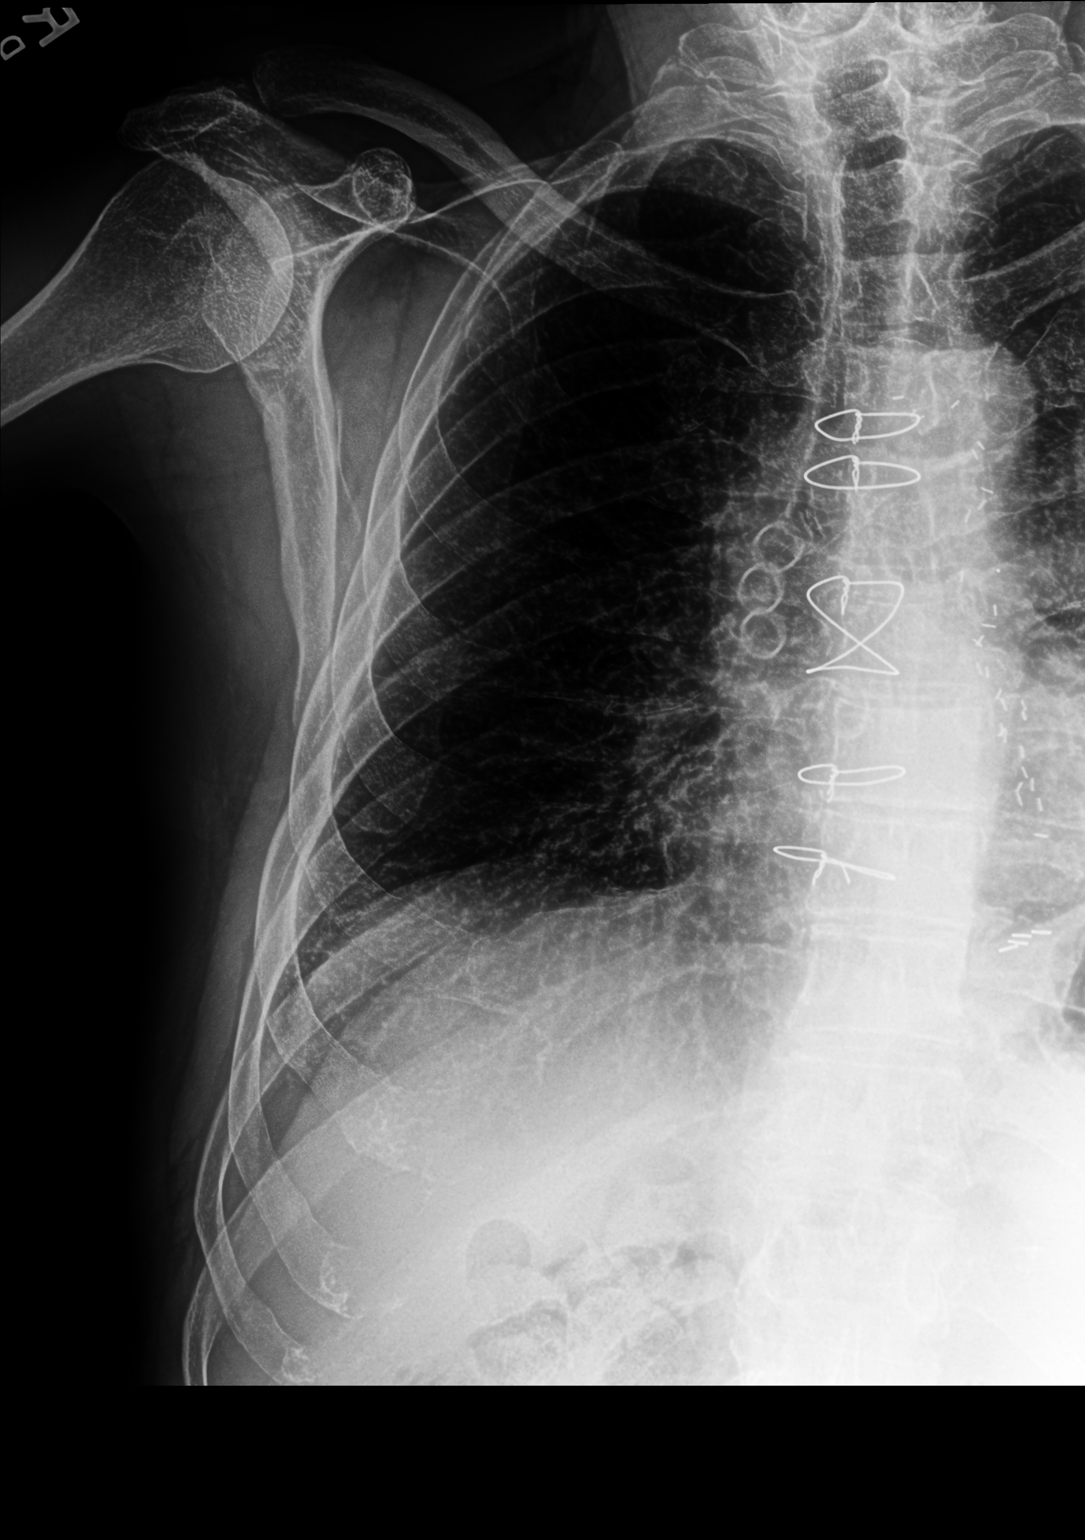

[chest pa (3 of 3)]
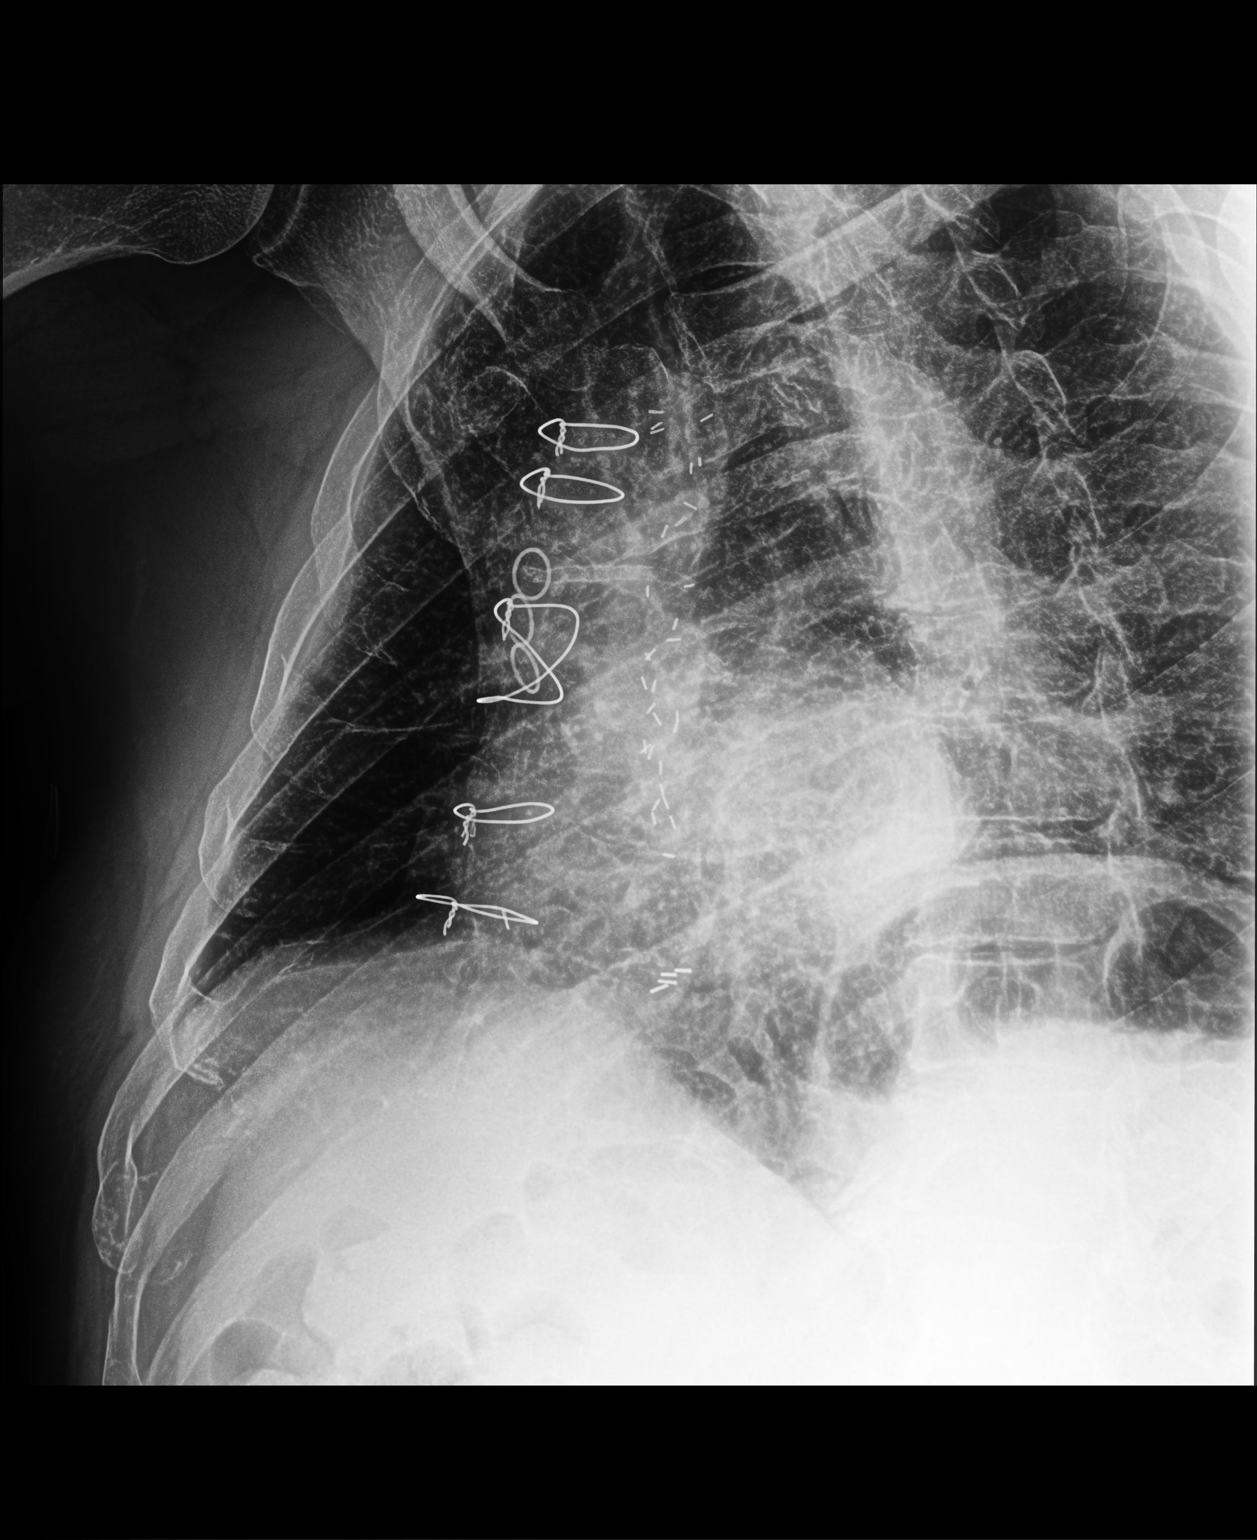

[3 of 3 positions shown; findings below may reference images not displayed]

FINDINGS: Overexposed film.

None Sternotomy wires overlie normal cardiac silhouette. Normal
pulmonary vasculature. No effusion, infiltrate, or pneumothorax. No
acute osseous abnormality.

Dedicated views of the RIGHT ribs demonstrate no displaced fracture.
IMPRESSION: 1.  No acute cardiopulmonary process.
2. No evidence of rib fracture.  Overexposed film.

## 2019-04-08 ENCOUNTER — Other Ambulatory Visit: Payer: Self-pay | Admitting: Internal Medicine

## 2019-05-06 ENCOUNTER — Encounter: Payer: Self-pay | Admitting: Internal Medicine

## 2019-05-06 ENCOUNTER — Ambulatory Visit (INDEPENDENT_AMBULATORY_CARE_PROVIDER_SITE_OTHER): Payer: PPO | Admitting: Internal Medicine

## 2019-05-06 DIAGNOSIS — F17200 Nicotine dependence, unspecified, uncomplicated: Secondary | ICD-10-CM

## 2019-05-06 DIAGNOSIS — E785 Hyperlipidemia, unspecified: Secondary | ICD-10-CM

## 2019-05-06 DIAGNOSIS — F17210 Nicotine dependence, cigarettes, uncomplicated: Secondary | ICD-10-CM | POA: Diagnosis not present

## 2019-05-06 DIAGNOSIS — Z7689 Persons encountering health services in other specified circumstances: Secondary | ICD-10-CM

## 2019-05-06 NOTE — Patient Instructions (Signed)
Thank you for choosing Primary Care at [facility] to be your medical home!    Jodie Echevaria was seen by Melina Schools, DO today.   Oval Linsey Pape's primary care provider is Phill Myron, DO.   For the best care possible, you should try to see Phill Myron, DO whenever you come to the clinic.   We look forward to seeing you again soon!  If you have any questions about your visit today, please call us at [phone] or feel free to reach your primary care provider via [place].

## 2019-05-06 NOTE — Progress Notes (Signed)
Virtual Visit via Telephone Note  I connected with [name] [name], on 05/06/2019 at 10:16 AM by telephone due to the COVID-19 pandemic and verified that I am speaking with the correct person using two identifiers.   Consent: I discussed the limitations, risks, security and privacy concerns of performing an evaluation and management service by telephone and the availability of in person appointments. I also discussed with the patient that there may be a patient responsible charge related to this service. The patient expressed understanding and agreed to proceed.   Location of Patient: Car outside of clinic   Location of Provider: Clinic    Persons participating in Telemedicine visit: Zakhi Uyehara [facility] Dr. Juleen China      History of Present Illness: [name] [name] is to establish care. Patient has a PMH significant for hyperlipidemia. Patient takes Simvastatin and ASA 81 mg daily without any issues. No concerns.     Past Medical History:  Diagnosis Date  . Bronchitis, chronic ([place])   . CAD (coronary artery disease)   . HTN (hypertension)   . Hyperlipidemia   . PVD (peripheral vascular disease) ([place])    No Known Allergies  Current Outpatient Medications on File Prior to Visit  Medication Sig Dispense Refill  . aspirin 81 MG tablet Take 81 mg by mouth daily.      . B Complex-C (SUPER B COMPLEX) TABS Take 1 tablet by mouth daily.      . niacin (NIASPAN) 500 MG CR tablet TAKE 1 CAPSULE BY MOUTH AT BEDTIME. 90 tablet 1  . simvastatin (ZOCOR) 20 MG tablet TAKE 1 TABLET BY MOUTH EVERY DAY 90 tablet 1   No current facility-administered medications on file prior to visit.    Observations/Objective: NAD. Speaking clearly.  Work of breathing normal.  Alert and oriented. Mood appropriate.   Assessment and Plan: 1. Encounter to establish care  2. Hyperlipidemia, unspecified hyperlipidemia type Continue with current medication regimen. Reviewed last LDL of 62 in July  2020.   3. Tobacco use disorder Patient currently smokes 1 ppd. Spent 3 minutes counseling on dangers of tobacco use and benefits of quitting, offered pharmacological intervention to aid quitting and patient is not ready to quit.    Follow Up Instructions: 6 months for chronic medical conditions and annual exam    I discussed the assessment and treatment plan with the patient. The patient was provided an opportunity to ask questions and all were answered. The patient agreed with the plan and demonstrated an understanding of the instructions.   The patient was advised to call back or seek an in-person evaluation if the symptoms worsen or if the condition fails to improve as anticipated.     I provided 12 minutes total of non-face-to-face time during this encounter including median intraservice time, reviewing previous notes, investigations, ordering medications, medical decision making, coordinating care and patient verbalized understanding at the end of the visit.    Phill Myron, D.O. Primary Care at [facility]  05/06/2019, 10:16 AM

## 2019-06-06 ENCOUNTER — Other Ambulatory Visit: Payer: Self-pay | Admitting: *Deleted

## 2019-06-06 MED ORDER — NIACIN ER (ANTIHYPERLIPIDEMIC) 500 MG PO TBCR: EXTENDED_RELEASE_TABLET | ORAL | 1 refills | Status: DC

## 2019-06-13 DIAGNOSIS — H2513 Age-related nuclear cataract, bilateral: Secondary | ICD-10-CM | POA: Diagnosis not present

## 2019-06-13 DIAGNOSIS — D3132 Benign neoplasm of left choroid: Secondary | ICD-10-CM | POA: Diagnosis not present

## 2019-10-01 ENCOUNTER — Other Ambulatory Visit: Payer: Self-pay

## 2019-10-01 MED ORDER — SIMVASTATIN 20 MG PO TABS: 20.0000 mg | ORAL_TABLET | Freq: Every day | ORAL | 1 refills | Status: DC

## 2019-11-04 DIAGNOSIS — M19042 Primary osteoarthritis, left hand: Secondary | ICD-10-CM | POA: Diagnosis not present

## 2019-11-04 DIAGNOSIS — M19142 Post-traumatic osteoarthritis, left hand: Secondary | ICD-10-CM | POA: Diagnosis not present

## 2019-11-18 ENCOUNTER — Encounter: Payer: PPO | Admitting: Internal Medicine

## 2019-11-29 ENCOUNTER — Other Ambulatory Visit: Payer: Self-pay | Admitting: Internal Medicine

## 2019-11-29 NOTE — Telephone Encounter (Signed)
Please refill as per office routine med refill policy (all routine meds refilled for 3 mo or monthly per pt preference up to one year from last visit, then month to month grace period for 3 mo, then further med refills will have to be denied)  

## 2019-12-01 ENCOUNTER — Ambulatory Visit: Payer: PPO | Admitting: Internal Medicine

## 2019-12-03 ENCOUNTER — Telehealth: Payer: Self-pay

## 2019-12-03 NOTE — Telephone Encounter (Signed)
Called patient to do their pre-visit COVID screening.  Call went to voicemail, unable to do prescreening.  Patient did confirm through [place] though.

## 2019-12-03 NOTE — Patient Instructions (Signed)

## 2019-12-04 ENCOUNTER — Encounter: Payer: Self-pay | Admitting: Internal Medicine

## 2019-12-04 ENCOUNTER — Other Ambulatory Visit: Payer: Self-pay

## 2019-12-04 ENCOUNTER — Ambulatory Visit (INDEPENDENT_AMBULATORY_CARE_PROVIDER_SITE_OTHER): Payer: PPO | Admitting: Internal Medicine

## 2019-12-04 VITALS — BP 140/83 | HR 75 | Temp 97.5°F | Resp 17 | Ht 67.0 in | Wt 169.0 lb

## 2019-12-04 DIAGNOSIS — Z0000 Encounter for general adult medical examination without abnormal findings: Secondary | ICD-10-CM | POA: Diagnosis not present

## 2019-12-04 DIAGNOSIS — Z125 Encounter for screening for malignant neoplasm of prostate: Secondary | ICD-10-CM

## 2019-12-04 DIAGNOSIS — Z1211 Encounter for screening for malignant neoplasm of colon: Secondary | ICD-10-CM

## 2019-12-04 DIAGNOSIS — Z23 Encounter for immunization: Secondary | ICD-10-CM

## 2019-12-04 NOTE — Progress Notes (Signed)
Subjective:   [name] [name] is a 69 y.o. male who presents for Medicare Annual/Subsequent preventive examination.  No concerns at all today. Was previously followed by Dr. 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   No problems with taking his medications. Not currently followed by any specialists.   Review of Systems:  Patient reports no  vision/ hearing changes,anorexia, weight change, fever ,adenopathy, persistant / recurrent hoarseness, swallowing issues, chest pain, edema,persistant / recurrent cough, hemoptysis, dyspnea(rest, exertional, paroxysmal nocturnal), gastrointestinal  bleeding (melena, rectal bleeding), abdominal pain, excessive heart burn, GU symptoms(dysuria, hematuria, pyuria, voiding/incontinence  Issues) syncope, focal weakness, severe memory loss, concerning skin lesions, depression, anxiety, abnormal bruising/bleeding, major joint swelling.         Objective:    Vitals: BP 140/83   Pulse 75   Temp (!) 97.5 F (36.4 C) (Temporal)   Resp 17   Ht 5\' 7"  (1.702 m)   Wt 169 lb (76.7 kg)   SpO2 95%   BMI 26.47 kg/m   Body mass index is 26.47 kg/m.  Physical Exam  Tobacco Social History   Tobacco Use  Smoking Status Current Every Day Smoker  . Packs/day: 1.00  . Years: 40.00  . Pack years: 40.00  . Types: Cigarettes  Smokeless Tobacco Never Used     Ready to quit: No Counseling given: Yes   Past Medical History:  Diagnosis Date  . Bronchitis, chronic ([place])   . CAD (coronary artery disease)   . HTN (hypertension)   . Hyperlipidemia   . PVD (peripheral vascular disease) ([place])    Past Surgical History:  Procedure Laterality Date  . CARDIAC CATHETERIZATION  02/23/2005  . coronary arteriography L heart cath    . CORONARY ARTERY BYPASS GRAFT  6/97   x 5   Family History  Problem Relation Age of Onset  . Heart attack Brother   . Heart attack Father 3  . Cancer Neg Hx    Social History   Socioeconomic History  .  Marital status: Married    Spouse name: Not on file  . Number of children: Not on file  . Years of education: Not on file  . Highest education level: Not on file  Occupational History  . Occupation: sharpening blades  Tobacco Use  . Smoking status: Current Every Day Smoker    Packs/day: 1.00    Years: 40.00    Pack years: 40.00    Types: Cigarettes  . Smokeless tobacco: Never Used  Substance and Sexual Activity  . Alcohol use: No    Alcohol/week: 0.0 standard drinks  . Drug use: No  . Sexual activity: Not on file  Other Topics Concern  . Not on file  Social History Narrative  . Not on file   Social Determinants of Health   Financial Resource Strain:   . Difficulty of Paying Living Expenses: Not on file  Food Insecurity:   . Worried About Charity fundraiser in the Last Year: Not on file  . Ran Out of Food in the Last Year: Not on file  Transportation Needs:   . Lack of Transportation (Medical): Not on file  . Lack of Transportation (Non-Medical): Not on file  Physical Activity:   . Days of Exercise per Week: Not on file  . Minutes of Exercise per Session: Not on file  Stress:   . Feeling of Stress : Not on file  Social Connections:   . Frequency of Communication  with Friends and Family: Not on file  . Frequency of Social Gatherings with Friends and Family: Not on file  . Attends Religious Services: Not on file  . Active Member of Clubs or Organizations: Not on file  . Attends Archivist Meetings: Not on file  . Marital Status: Not on file    Outpatient Encounter Medications as of 12/04/2019  Medication Sig  . aspirin 81 MG tablet Take 81 mg by mouth daily.    . B Complex-C (SUPER B COMPLEX) TABS Take 1 tablet by mouth daily.    . niacin (NIASPAN) 500 MG CR tablet TAKE 1 TABLET BY MOUTH EVERYDAY AT BEDTIME  . simvastatin (ZOCOR) 20 MG tablet Take 1 tablet (20 mg total) by mouth daily.   No facility-administered encounter medications on file as of  12/04/2019.    Activities of Daily Living No flowsheet data found.  Patient Care Team: Nicolette Bang, DO as PCP - General (Family Medicine)   Assessment:   This is a routine wellness examination for [place].  Exercise Activities and Dietary recommendations Discussed DASH diet.  He is very active maintaining his 4 acre property.     Goals   None     Fall Risk Fall Risk  12/04/2019 11/14/2018 11/13/2018 05/03/2017  Falls in the past year? 1 1 (No Data) No  Comment - - Emmi Telephone Survey: data to providers prior to load -  Number falls in past yr: 0 0 (No Data) -  Comment - - Emmi Telephone Survey Actual Response =  -  Injury with Fall? 1 0 - -   Patient slipped off curb in May and reached out with his hand to break the fall. 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   Is the patient's home free of loose throw rugs in walkways, pet beds, electrical cords, etc?   no      Grab bars in the bathroom? no      Handrails on the stairs?   N/A no stairs      Adequate lighting?   yes  Timed Get Up and Go Performed: Yes and passed  Patient rating of health (0-10): 9.8   Depression Screen PHQ 2/9 Scores 12/04/2019 11/14/2018 05/03/2017  PHQ - 2 Score 0 0 0    Cognitive Function Passed 3 word recall and clock testing. See flowsheet.   Immunization History  Administered Date(s) Administered  . Influenza Whole 02/16/2000  . Influenza, High Dose Seasonal PF 02/11/2019  . Influenza,inj,Quad PF,6+ Mos 01/12/2015  . Pneumococcal Conjugate-13 08/13/2014  . Pneumococcal Polysaccharide-23 02/16/2000, 07/24/2008, 11/14/2018  . Td 07/24/2008  . Tdap 11/14/2018    Screening Tests Health Maintenance  Topic Date Due  . COVID-19 Vaccine (1) Never done  . COLONOSCOPY  Never done  . INFLUENZA VACCINE  11/16/2019  . TETANUS/TDAP  11/13/2028  . Hepatitis C Screening  Completed  . PNA vac Low Risk Adult  Completed   Cancer Screenings: Lung: Low Dose CT Chest recommended if Age 31-80 years, 30  pack-year currently smoking OR have quit w/in 15years. Patient does qualify. However, he declines.  Colorectal: Discussed options for colon cancer screening and patient declined all of them.   Additional Screenings:  Declines PSA after risk and benefit discussion.       Plan:    1. Annual physical exam - CBC with Differential - Comprehensive metabolic panel - Lipid Panel  2. Needs flu shot - Flu Vaccine QUAD 6+ mos PF IM (Fluarix Quad PF)  I have personally reviewed and noted the following in the patient's chart:   . Medical and social history . Use of alcohol, tobacco or illicit drugs  . Current medications and supplements . Functional ability and status . Nutritional status . Physical activity . Advanced directives . List of other physicians . Hospitalizations, surgeries, and ER visits in previous 12 months . Vitals . Screenings to include cognitive, depression, and falls . Referrals and appointments  In addition, I have reviewed and discussed with patient certain preventive protocols, quality metrics, and best practice recommendations. A written personalized care plan for preventive services as well as general preventive health recommendations were provided to patient.      Melina Schools, DO  12/04/2019

## 2019-12-05 ENCOUNTER — Other Ambulatory Visit: Payer: Self-pay | Admitting: Internal Medicine

## 2019-12-05 LAB — COMPREHENSIVE METABOLIC PANEL
ALT: 15 IU/L (ref 0–44)
AST: 24 IU/L (ref 0–40)
Albumin/Globulin Ratio: 1.6 (ref 1.2–2.2)
Albumin: 4.5 g/dL (ref 3.8–4.8)
Alkaline Phosphatase: 60 IU/L (ref 48–121)
BUN/Creatinine Ratio: 11 (ref 10–24)
BUN: 11 mg/dL (ref 8–27)
Bilirubin Total: 0.5 mg/dL (ref 0.0–1.2)
CO2: 28 mmol/L (ref 20–29)
Calcium: 9.9 mg/dL (ref 8.6–10.2)
Chloride: 101 mmol/L (ref 96–106)
Creatinine, Ser: 1.02 mg/dL (ref 0.76–1.27)
GFR calc Af Amer: 86 mL/min/{1.73_m2} (ref >59)
GFR calc non Af Amer: 74 mL/min/{1.73_m2} (ref >59)
Globulin, Total: 2.9 g/dL (ref 1.5–4.5)
Glucose: 97 mg/dL (ref 65–99)
Potassium: 4.7 mmol/L (ref 3.5–5.2)
Sodium: 139 mmol/L (ref 134–144)
Total Protein: 7.4 g/dL (ref 6.0–8.5)

## 2019-12-05 LAB — CBC WITH DIFFERENTIAL/PLATELET
Basophils Absolute: 0.1 10*3/uL (ref 0.0–0.2)
Basos: 1 %
EOS (ABSOLUTE): 0.1 10*3/uL (ref 0.0–0.4)
Eos: 1 %
Hematocrit: 51.7 % — ABNORMAL HIGH (ref 37.5–51.0)
Hemoglobin: 17.2 g/dL (ref 13.0–17.7)
Immature Grans (Abs): 0 10*3/uL (ref 0.0–0.1)
Immature Granulocytes: 0 %
Lymphocytes Absolute: 1.9 10*3/uL (ref 0.7–3.1)
Lymphs: 18 %
MCH: 32.2 pg (ref 26.6–33.0)
MCHC: 33.3 g/dL (ref 31.5–35.7)
MCV: 97 fL (ref 79–97)
Monocytes Absolute: 0.7 10*3/uL (ref 0.1–0.9)
Monocytes: 7 %
Neutrophils Absolute: 8 10*3/uL — ABNORMAL HIGH (ref 1.4–7.0)
Neutrophils: 73 %
Platelets: 233 10*3/uL (ref 150–450)
RBC: 5.34 x10E6/uL (ref 4.14–5.80)
RDW: 12.5 % (ref 11.6–15.4)
WBC: 10.8 10*3/uL (ref 3.4–10.8)

## 2019-12-05 LAB — LIPID PANEL
Chol/HDL Ratio: 3.4 ratio (ref 0.0–5.0)
Cholesterol, Total: 152 mg/dL (ref 100–199)
HDL: 45 mg/dL (ref >39)
LDL Chol Calc (NIH): 79 mg/dL (ref 0–99)
Triglycerides: 160 mg/dL — ABNORMAL HIGH (ref 0–149)
VLDL Cholesterol Cal: 28 mg/dL (ref 5–40)

## 2019-12-05 MED ORDER — ATORVASTATIN CALCIUM 40 MG PO TABS: 40.0000 mg | ORAL_TABLET | Freq: Every day | ORAL | 3 refills | Status: DC

## 2020-02-23 ENCOUNTER — Other Ambulatory Visit: Payer: Self-pay | Admitting: Internal Medicine

## 2020-03-16 ENCOUNTER — Ambulatory Visit: Payer: PPO | Admitting: Internal Medicine

## 2020-04-14 ENCOUNTER — Telehealth: Payer: Self-pay | Admitting: Internal Medicine

## 2020-04-14 ENCOUNTER — Other Ambulatory Visit: Payer: Self-pay | Admitting: Internal Medicine

## 2020-04-14 MED ORDER — NIACIN ER (ANTIHYPERLIPIDEMIC) 500 MG PO TBCR
EXTENDED_RELEASE_TABLET | ORAL | 1 refills | Status: DC
Start: 2020-04-14 — End: 2020-10-25

## 2020-04-14 NOTE — Telephone Encounter (Signed)
Sent to PCP to refill if appropriate.  

## 2020-04-14 NOTE — Telephone Encounter (Signed)
Per DPR patient wife is aware that refill has been sent.

## 2020-04-14 NOTE — Telephone Encounter (Signed)
Patients wife called In requesting refills of niacin. States he is completely out. Please advise.

## 2020-04-14 NOTE — Telephone Encounter (Signed)
Refill sent in for Niacin. Thanks!   Marcy Siren, D.O. Primary Care at [facility]  04/14/2020, 1:30 PM

## 2020-10-19 ENCOUNTER — Telehealth: Payer: Self-pay | Admitting: Internal Medicine

## 2020-10-19 NOTE — Telephone Encounter (Signed)
Pt's wife called stating he took his last pill and needs a refill.  niacin (NIASPAN) 500 MG CR tablet     Pharmacy  [id number] [name], [address] [place] [address]  Phone:  [phone]  Fax:  [phone]  DEA #:  [id number]

## 2020-10-21 NOTE — Telephone Encounter (Signed)
Called patient and lvm to call back to schedule an appointment to get his medication refills

## 2020-10-25 ENCOUNTER — Telehealth: Payer: PPO | Admitting: Physician Assistant

## 2020-10-25 ENCOUNTER — Other Ambulatory Visit: Payer: Self-pay | Admitting: Physician Assistant

## 2020-10-25 DIAGNOSIS — E785 Hyperlipidemia, unspecified: Secondary | ICD-10-CM

## 2020-10-25 MED ORDER — ATORVASTATIN CALCIUM 40 MG PO TABS: 40.0000 mg | ORAL_TABLET | Freq: Every day | ORAL | 0 refills | Status: AC

## 2020-10-25 MED ORDER — NIACIN ER (ANTIHYPERLIPIDEMIC) 500 MG PO TBCR: EXTENDED_RELEASE_TABLET | ORAL | 0 refills | Status: DC

## 2020-10-25 NOTE — Progress Notes (Signed)
Courtesy refill  Kayliegh Boyers S. Mayers, PA-C Physician Assistant [facility] [email or web address]  

## 2020-12-14 ENCOUNTER — Encounter: Payer: PPO | Admitting: Family

## 2020-12-14 ENCOUNTER — Encounter: Payer: PPO | Admitting: Family Medicine

## 2020-12-17 ENCOUNTER — Other Ambulatory Visit: Payer: Self-pay

## 2020-12-19 ENCOUNTER — Other Ambulatory Visit: Payer: Self-pay | Admitting: Physician Assistant

## 2020-12-19 DIAGNOSIS — E785 Hyperlipidemia, unspecified: Secondary | ICD-10-CM

## 2020-12-21 ENCOUNTER — Encounter: Payer: PPO | Admitting: Family Medicine

## 2021-01-13 DIAGNOSIS — Z5320 Procedure and treatment not carried out because of patient's decision for unspecified reasons: Secondary | ICD-10-CM | POA: Diagnosis not present

## 2021-01-13 DIAGNOSIS — E785 Hyperlipidemia, unspecified: Secondary | ICD-10-CM | POA: Diagnosis not present

## 2021-01-13 DIAGNOSIS — M2550 Pain in unspecified joint: Secondary | ICD-10-CM | POA: Diagnosis not present

## 2021-01-13 DIAGNOSIS — I2510 Atherosclerotic heart disease of native coronary artery without angina pectoris: Secondary | ICD-10-CM | POA: Diagnosis not present

## 2021-01-13 DIAGNOSIS — Z720 Tobacco use: Secondary | ICD-10-CM | POA: Diagnosis not present

## 2021-02-09 DIAGNOSIS — H43813 Vitreous degeneration, bilateral: Secondary | ICD-10-CM | POA: Diagnosis not present

## 2021-02-09 DIAGNOSIS — D3132 Benign neoplasm of left choroid: Secondary | ICD-10-CM | POA: Diagnosis not present

## 2021-02-09 DIAGNOSIS — H524 Presbyopia: Secondary | ICD-10-CM | POA: Diagnosis not present

## 2021-02-09 DIAGNOSIS — H2513 Age-related nuclear cataract, bilateral: Secondary | ICD-10-CM | POA: Diagnosis not present

## 2021-02-09 DIAGNOSIS — H25041 Posterior subcapsular polar age-related cataract, right eye: Secondary | ICD-10-CM | POA: Diagnosis not present

## 2021-02-09 DIAGNOSIS — H5203 Hypermetropia, bilateral: Secondary | ICD-10-CM | POA: Diagnosis not present

## 2021-02-09 DIAGNOSIS — H52223 Regular astigmatism, bilateral: Secondary | ICD-10-CM | POA: Diagnosis not present
